# Patient Record
Sex: Female | Born: 1995 | Race: Black or African American | Hispanic: No | Marital: Single | Smoking: Never smoker
Health system: Southern US, Community
[De-identification: ages and names within clinical notes are randomized; demographics above are authoritative.]

## PROBLEM LIST (undated history)

## (undated) DIAGNOSIS — Z789 Other specified health status: Secondary | ICD-10-CM

## (undated) HISTORY — PX: NO PAST SURGERIES: SHX2092

---

## 2020-07-15 ENCOUNTER — Ambulatory Visit (HOSPITAL_COMMUNITY)
Admission: EM | Admit: 2020-07-15 | Discharge: 2020-07-15 | Disposition: A | Discharge status: Home / Self Care | Payer: Medicaid Other | Attending: Emergency Medicine | Admitting: Emergency Medicine

## 2020-07-15 ENCOUNTER — Other Ambulatory Visit: Payer: Self-pay

## 2020-07-15 ENCOUNTER — Encounter (HOSPITAL_COMMUNITY): Payer: Self-pay | Admitting: Emergency Medicine

## 2020-07-15 DIAGNOSIS — M2141 Flat foot [pes planus] (acquired), right foot: Secondary | ICD-10-CM | POA: Diagnosis not present

## 2020-07-15 DIAGNOSIS — M25572 Pain in left ankle and joints of left foot: Secondary | ICD-10-CM | POA: Diagnosis present

## 2020-07-15 DIAGNOSIS — M25571 Pain in right ankle and joints of right foot: Secondary | ICD-10-CM

## 2020-07-15 DIAGNOSIS — M2142 Flat foot [pes planus] (acquired), left foot: Secondary | ICD-10-CM | POA: Diagnosis not present

## 2020-07-15 MED ORDER — MELOXICAM 7.5 MG PO TABS
7.5000 mg | ORAL_TABLET | Freq: Every day | ORAL | 0 refills | Status: DC
Start: 2020-07-15 — End: 2020-08-25

## 2020-07-15 NOTE — ED Provider Notes (Signed)
[place]    CSN: [id number] Arrival date & time: 07/15/20  1010      History   Chief Complaint Chief Complaint  Patient presents with  . Ankle Pain    HPI Sherry Ryan is a 24 y.o. female.   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 No trauam,injury,or fall.  The history is provided by the patient. No language interpreter was used.    History reviewed. No pertinent past medical history.  Patient Active Problem List   Diagnosis Date Noted  . Flat feet, bilateral 07/15/2020  . Acute bilateral ankle pain 07/15/2020    History reviewed. No pertinent surgical history.  OB History   No obstetric history on file.      Home Medications    Prior to Admission medications   Medication Sig Start Date End Date Taking? Authorizing Provider  meloxicam (MOBIC) 7.5 MG tablet Take 1 tablet (7.5 mg total) by mouth daily. 7/82/95   Shanecia Hoganson, Jeanett Schlein, NP    Family History Family History  Problem Relation Age of Onset  . Healthy Mother   . Healthy Father     Social History Social History   Tobacco Use  . Smoking status: Never Smoker  Substance Use Topics  . Alcohol use: Yes  . Drug use: Never     Allergies   Patient has no known allergies.   Review of Systems Review of Systems  Constitutional: Positive for activity change. Negative for fever.  HENT: Negative.   Eyes: Negative.   Respiratory: Negative for shortness of breath.   Cardiovascular: Negative for chest pain.  Gastrointestinal: Negative for abdominal pain.  Endocrine: Negative.   Genitourinary: Negative.   Musculoskeletal: Positive for gait problem and myalgias. Negative for back pain.  Skin: Negative for color change and wound.  Neurological: Negative for headaches.  Hematological: Negative.   Psychiatric/Behavioral: Negative.   All other  systems reviewed and are negative.    Physical Exam Triage Vital Signs ED Triage Vitals  Enc Vitals Group     BP 07/15/20 1130 119/72     Pulse Rate 07/15/20 1130 84     Resp 07/15/20 1130 18     Temp 07/15/20 1130 98.9 F (37.2 C)     Temp Source 07/15/20 1130 Oral     SpO2 07/15/20 1130 97 %     Weight --      Height --      Head Circumference --      Peak Flow --      Pain Score 07/15/20 1127 8     Pain Loc --      Pain Edu? --      Excl. in [place]? --    No data found.  Updated Vital Signs BP 119/72 (BP Location: Right Arm)   Pulse 84   Temp 98.9 F (37.2 C) (Oral)   Resp 18   LMP 06/07/2020   SpO2 97%    Physical Exam Vitals and nursing note reviewed.  Constitutional:      General: She is not in acute distress.    Appearance: She is well-developed. She is not ill-appearing or toxic-appearing.  HENT:     Head: Normocephalic.     Right Ear: Tympanic membrane normal.     Left Ear: Tympanic membrane normal.     Nose: Nose normal.  Mouth/Throat:     Pharynx: Uvula midline.  Eyes:     Pupils: Pupils are equal, round, and reactive to light.  Neck:     Trachea: Trachea normal.     Meningeal: Brudzinski's sign and Kernig's sign absent.  Cardiovascular:     Rate and Rhythm: Normal rate and regular rhythm.     Pulses:          Popliteal pulses are 2+ on the right side and 2+ on the left side.  Pulmonary:     Effort: Pulmonary effort is normal.     Breath sounds: Normal breath sounds.  Musculoskeletal:     Cervical back: Full passive range of motion without pain and normal range of motion.     Right foot: Tenderness present.     Left foot: Tenderness present.     Comments: Extreme flat feet bilateral with inversion of ankle noted as normal stance  Skin:    General: Skin is warm and dry.     Findings: No rash.  Neurological:     General: No focal deficit present.     Mental Status: She is alert and oriented to person, place, and time.     GCS: GCS eye  subscore is 4. GCS verbal subscore is 5. GCS motor subscore is 6.  Psychiatric:        Attention and Perception: Attention normal.        Mood and Affect: Mood normal.        Speech: Speech normal.        Behavior: Behavior normal. Behavior is cooperative.      UC Treatments / Results  Labs (all labs ordered are listed, but only abnormal results are displayed) Labs Reviewed - No data to display  EKG   Radiology No results found.  Procedures Procedures (including critical care time)  Medications Ordered in UC Medications - No data to display  Initial Impression / Assessment and Plan / UC Course  I have reviewed the triage vital signs and the nursing notes.  Pertinent labs & imaging results that were available during my care of the patient were reviewed by me and considered in my medical decision making (see chart for details).     Wear supportive shoes. Follow up with podiatrist of your choice, info regarding [facility] given.  Final Clinical Impressions(s) / UC Diagnoses   Final diagnoses:  Flat feet, bilateral  Acute bilateral ankle pain     Discharge Instructions     Wear supportive shoes. Follow up with podiatrist of your choice, info regarding [facility] given.     ED Prescriptions    Medication Sig Dispense Auth. Provider   meloxicam (MOBIC) 7.5 MG tablet Take 1 tablet (7.5 mg total) by mouth daily. 10 tablet Lorren Splawn, Jeanett Schlein, NP     PDMP not reviewed this encounter.   Tori Milks, NP 28/31/51 1239

## 2020-07-15 NOTE — Discharge Instructions (Addendum)
Wear supportive shoes. Follow up with podiatrist of your choice, info regarding [facility] given.

## 2020-07-15 NOTE — ED Triage Notes (Signed)
Patient has had ankle pain for a week.  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

## 2020-07-28 ENCOUNTER — Ambulatory Visit (INDEPENDENT_AMBULATORY_CARE_PROVIDER_SITE_OTHER): Payer: Medicaid Other

## 2020-07-28 ENCOUNTER — Ambulatory Visit: Payer: Medicaid Other | Admitting: Podiatry

## 2020-07-28 ENCOUNTER — Other Ambulatory Visit: Payer: Self-pay

## 2020-07-28 DIAGNOSIS — M2142 Flat foot [pes planus] (acquired), left foot: Secondary | ICD-10-CM | POA: Diagnosis not present

## 2020-07-28 DIAGNOSIS — M216X1 Other acquired deformities of right foot: Secondary | ICD-10-CM

## 2020-07-28 DIAGNOSIS — M216X2 Other acquired deformities of left foot: Secondary | ICD-10-CM | POA: Diagnosis not present

## 2020-07-28 DIAGNOSIS — M2141 Flat foot [pes planus] (acquired), right foot: Secondary | ICD-10-CM

## 2020-07-28 MED ORDER — DICLOFENAC SODIUM 75 MG PO TBEC
75.0000 mg | DELAYED_RELEASE_TABLET | Freq: Two times a day (BID) | ORAL | 1 refills | Status: DC
Start: 2020-07-28 — End: 2020-08-25

## 2020-07-28 MED ORDER — METHYLPREDNISOLONE 4 MG PO TBPK
ORAL_TABLET | ORAL | 0 refills | Status: DC
Start: 2020-07-28 — End: 2021-10-07

## 2020-07-28 NOTE — Progress Notes (Signed)
   Subjective:  24 y.o. female presenting today as a new patient for evaluation of bilateral foot pain.  Patient states that she works on her feet for most of the day at [employer]  She states that she has had severe pain to the bilateral feet for the past 2-3 months now.  She does wear a brace for support and wears gel inserts to help cushion her feet.  She is unable to stand for long periods of time or walk.  She is also currently taking meloxicam 7.5 mg daily with no relief.  She presents for further treatment evaluation   No past medical history on file.     Objective/Physical Exam General: The patient is alert and oriented x3 in no acute distress.  Dermatology: Skin is warm, dry and supple bilateral lower extremities. Negative for open lesions or macerations.  Vascular: Palpable pedal pulses bilaterally. No edema or erythema noted. Capillary refill within normal limits.  Neurological: Epicritic and protective threshold grossly intact bilaterally.   Musculoskeletal Exam: Range of motion within normal limits to all pedal and ankle joints bilateral. Muscle strength 5/5 in all groups bilateral.  Upon weightbearing there is a medial longitudinal arch collapse bilaterally. Remove foot valgus noted to the bilateral lower extremities with excessive pronation upon mid stance.  Radiographic Exam:  Normal osseous mineralization. Joint spaces preserved. No fracture/dislocation/boney destruction.   Pes planus noted on radiographic exam lateral views. Decreased calcaneal inclination and metatarsal declination angle is noted. Anterior break in the cyma line noted on lateral views. Medial talar head to deviation noted on AP radiograph.   Assessment: 1. pes planus bilateral  Plan of Care:  1. Patient was evaluated. X-Rays reviewed.  2.  Appointment with Pedorthist for custom molded insoles 3.  Prescription for Medrol Dosepak 4.  Prescription for diclofenac 75 mg 2 times daily.  Discontinue  meloxicam 7.5 mg 5.  Recommend good supportive shoes 6.  Return to clinic in 6 months.  If there is no improvement, due to the severe flatfoot deformity, we may need to consider surgical reconstruction   Edrick Kins, DPM [facility]  Dr. Edrick Kins, [address]                                        [place], [place] [address]                Office [phone]  Fax [phone]

## 2020-08-10 ENCOUNTER — Ambulatory Visit (INDEPENDENT_AMBULATORY_CARE_PROVIDER_SITE_OTHER): Payer: Medicaid Other | Admitting: Orthotics

## 2020-08-10 ENCOUNTER — Other Ambulatory Visit: Payer: Self-pay

## 2020-08-10 DIAGNOSIS — M2142 Flat foot [pes planus] (acquired), left foot: Secondary | ICD-10-CM | POA: Diagnosis not present

## 2020-08-10 DIAGNOSIS — M2141 Flat foot [pes planus] (acquired), right foot: Secondary | ICD-10-CM

## 2020-08-10 NOTE — Progress Notes (Signed)
Patient is being seen today for f/o to address congential pes planus/pes planovalgus. Patient is active youth and demonstrates over pronation in gait, prominent medially shifted talus, and collapse of medial column.  Goals are RF stability, longitudinal arch support, decrease in pronation, and ease of discomfort in mobility related activities.   

## 2020-08-25 ENCOUNTER — Telehealth: Payer: Self-pay | Admitting: Podiatrist

## 2020-08-25 MED ORDER — MELOXICAM 15 MG PO TABS: 15.0000 mg | ORAL_TABLET | Freq: Every day | ORAL | 2 refills | Status: DC

## 2020-08-25 NOTE — Telephone Encounter (Signed)
Patient called asking about the medication called in and if it is covered by her insurance (diclofenac)-  I checked, and it is not on the preferred medication list.  Rx for Meloxicam 15 mg was called in after speaking with the patient- she had previously been on meloxicam 7.5 and states it worked ok but did not last.  Discussed with Dr. Amalia Hailey

## 2020-10-12 ENCOUNTER — Encounter: Payer: Medicaid Other | Admitting: Orthotics

## 2020-10-21 ENCOUNTER — Encounter: Payer: Medicaid Other | Admitting: Orthotics

## 2020-11-09 ENCOUNTER — Encounter: Payer: Medicaid Other | Admitting: Orthotics

## 2021-03-21 ENCOUNTER — Other Ambulatory Visit: Payer: Self-pay

## 2021-03-21 ENCOUNTER — Ambulatory Visit (HOSPITAL_COMMUNITY)
Admission: EM | Admit: 2021-03-21 | Discharge: 2021-03-21 | Disposition: A | Discharge status: Home / Self Care | Payer: Medicaid Other | Attending: Physician Assistant | Admitting: Physician Assistant

## 2021-03-21 ENCOUNTER — Encounter (HOSPITAL_COMMUNITY): Payer: Self-pay

## 2021-03-21 DIAGNOSIS — H6123 Impacted cerumen, bilateral: Secondary | ICD-10-CM | POA: Diagnosis not present

## 2021-03-21 DIAGNOSIS — H938X3 Other specified disorders of ear, bilateral: Secondary | ICD-10-CM | POA: Diagnosis not present

## 2021-03-21 DIAGNOSIS — H60392 Other infective otitis externa, left ear: Secondary | ICD-10-CM | POA: Diagnosis not present

## 2021-03-21 MED ORDER — OFLOXACIN 0.3 % OT SOLN
5.0000 [drp] | Freq: Two times a day (BID) | OTIC | 0 refills | Status: DC
Start: 2021-03-21 — End: 2021-10-07

## 2021-03-21 NOTE — ED Provider Notes (Signed)
[place]    CSN: [id number] Arrival date & time: 03/21/21  1403      History   Chief Complaint Chief Complaint  Patient presents with  . Ear Fullness    HPI Sherry Ryan is a 25 y.o. female.   Sherry Ryan presents today with a several week history of ear fullness. She denies any additional symptoms including cough, congestion, otorrhea, fever. Pain is rated 7 on a 0-10 pain scale, localized to bilateral ears without radiation, described as popping, no aggravating or alleviating factors identified. She has tried hydrogen peroxide without improvement of symptoms. She denies history of otitis media and has not seen ENT in the past. She denies recent diving, swimming, air plane travel. She has taken tylenol and ibuprofen without improvement of symptoms.      History reviewed. No pertinent past medical history.  Patient Active Problem List   Diagnosis Date Noted  . Flat feet, bilateral 07/15/2020  . Acute bilateral ankle pain 07/15/2020    History reviewed. No pertinent surgical history.  OB History   No obstetric history on file.      Home Medications    Prior to Admission medications   Medication Sig Start Date End Date Taking? Authorizing Provider  ofloxacin (FLOXIN) 0.3 % OTIC solution Place 5 drops into the left ear 2 (two) times daily. 03/21/21  Yes Hilliard Borges, Derry Skill, PA-C  meloxicam (MOBIC) 15 MG tablet Take 1 tablet (15 mg total) by mouth daily. Take with food. 08/25/20   Bronson Ing, DPM  methylPREDNISolone (MEDROL DOSEPAK) 4 MG TBPK tablet 6 day dose pack - take as directed 07/28/20   Edrick Kins, DPM    Family History Family History  Problem Relation Age of Onset  . Healthy Mother   . Healthy Father     Social History Social History   Tobacco Use  . Smoking status: Never Smoker  Substance Use Topics  . Alcohol use: Yes  . Drug use: Never     Allergies   Patient has no known allergies.   Review of Systems Review of  Systems  Constitutional: Negative for activity change, appetite change, fatigue and fever.  HENT: Positive for ear pain and hearing loss. Negative for congestion, sinus pressure, sneezing and sore throat.   Respiratory: Negative for cough and shortness of breath.   Cardiovascular: Negative for chest pain.  Gastrointestinal: Negative for abdominal pain, diarrhea, nausea and vomiting.  Neurological: Negative for dizziness, light-headedness and headaches.     Physical Exam Triage Vital Signs ED Triage Vitals  Enc Vitals Group     BP 03/21/21 1450 130/85     Pulse Rate 03/21/21 1450 98     Resp 03/21/21 1450 18     Temp 03/21/21 1450 98.7 F (37.1 C)     Temp src --      SpO2 03/21/21 1450 100 %     Weight --      Height --      Head Circumference --      Peak Flow --      Pain Score 03/21/21 1449 7     Pain Loc --      Pain Edu? --      Excl. in [place]? --    No data found.  Updated Vital Signs BP 130/85   Pulse 98   Temp 98.7 F (37.1 C)   Resp 18   LMP 03/21/2021   SpO2 100%   Visual Acuity Right Eye Distance:  Left Eye Distance:   Bilateral Distance:    Right Eye Near:   Left Eye Near:    Bilateral Near:     Physical Exam Vitals reviewed.  Constitutional:      General: She is awake. She is not in acute distress.    Appearance: Normal appearance. She is not ill-appearing.     Comments: Very pleasant female appears stated age in no acute distress   HENT:     Head: Normocephalic and atraumatic.     Right Ear: Tympanic membrane, ear canal and external ear normal. There is impacted cerumen. Tympanic membrane is not erythematous or bulging.     Left Ear: Tympanic membrane, ear canal and external ear normal. There is impacted cerumen. Tympanic membrane is not erythematous or bulging.     Ears:     Comments: Cerumen impaction noted on exam. Resolved with irrigation revealing normal TM on right. Continued cerumen impaction on left as patient was not able to tolerate  irrigation. Erythema noted of left ear canal without otorrhea; mild tenderness to palpation of tragus.      Mouth/Throat:     Pharynx: Uvula midline. No oropharyngeal exudate or posterior oropharyngeal erythema.     Comments: Drainage present in posterior oropharynx.  Cardiovascular:     Rate and Rhythm: Normal rate and regular rhythm.     Heart sounds: No murmur heard.   Pulmonary:     Effort: Pulmonary effort is normal.     Breath sounds: Normal breath sounds. No wheezing, rhonchi or rales.     Comments: Clear to auscultation bilaterally  Musculoskeletal:     Right lower leg: No edema.     Left lower leg: No edema.  Lymphadenopathy:     Head:     Right side of head: No submental, submandibular or tonsillar adenopathy.     Left side of head: No submental, submandibular or tonsillar adenopathy.     Cervical: No cervical adenopathy.  Psychiatric:        Behavior: Behavior is cooperative.      UC Treatments / Results  Labs (all labs ordered are listed, but only abnormal results are displayed) Labs Reviewed - No data to display  EKG   Radiology No results found.  Procedures Procedures (including critical care time)  Medications Ordered in UC Medications - No data to display  Initial Impression / Assessment and Plan / UC Course  I have reviewed the triage vital signs and the nursing notes.  Pertinent labs & imaging results that were available during my care of the patient were reviewed by me and considered in my medical decision making (see chart for details).     Cerumen impaction resolved with in office irrigation on right. Patient was unable to tolerate irrigation on left. She was encouraged to use over the counter cerumenolytics. Given significant erythema and pain with percussion of tragus will treat for otitis externa and patient prescribed ofloxacin drops. She was instructed to avoid putting anything in ear canals or submerging head in water until symptoms  resolve. She was instructed to follow up with PCP for recheck in a week. Strict return precautions given to which patient expressed understanding.   Final Clinical Impressions(s) / UC Diagnoses   Final diagnoses:  Ear fullness, bilateral  Bilateral impacted cerumen  Infective otitis externa of left ear     Discharge Instructions     Use the drops twice a day. Keep ear facing up for several minutes after using drops. Do  not use q-tips. You can use over the counter medication for pain relief.      ED Prescriptions    Medication Sig Dispense Auth. Provider   ofloxacin (FLOXIN) 0.3 % OTIC solution Place 5 drops into the left ear 2 (two) times daily. 5 mL Eilah Common K, PA-C     PDMP not reviewed this encounter.   Terrilee Croak, PA-C 03/21/21 1551

## 2021-03-21 NOTE — ED Triage Notes (Signed)
Pt presents with c/o bilateral ear fullness, some soreness in left ear , symptoms began about a week ago

## 2021-03-21 NOTE — Discharge Instructions (Addendum)
Use the drops twice a day. Keep ear facing up for several minutes after using drops. Do not use q-tips. You can use over the counter medication for pain relief.

## 2021-03-21 NOTE — ED Notes (Addendum)
Ear wax removal right ear, pt did not tolerate in the left ear, Raspet, PA notified.

## 2021-06-15 ENCOUNTER — Other Ambulatory Visit: Payer: Self-pay | Admitting: Internal Medicine

## 2021-06-15 DIAGNOSIS — R1900 Intra-abdominal and pelvic swelling, mass and lump, unspecified site: Secondary | ICD-10-CM

## 2021-07-12 ENCOUNTER — Other Ambulatory Visit: Payer: Medicaid Other

## 2021-08-17 ENCOUNTER — Other Ambulatory Visit: Payer: Self-pay | Admitting: Internal Medicine

## 2021-08-17 ENCOUNTER — Other Ambulatory Visit: Payer: Self-pay | Admitting: Family Medicine

## 2021-08-17 DIAGNOSIS — R1900 Intra-abdominal and pelvic swelling, mass and lump, unspecified site: Secondary | ICD-10-CM

## 2021-08-18 ENCOUNTER — Ambulatory Visit
Admission: RE | Admit: 2021-08-18 | Discharge: 2021-08-18 | Disposition: A | Discharge status: Home / Self Care | Payer: Medicaid Other | Source: Ambulatory Visit | Attending: Internal Medicine | Admitting: Internal Medicine

## 2021-08-18 ENCOUNTER — Other Ambulatory Visit: Payer: Self-pay

## 2021-08-18 DIAGNOSIS — R1900 Intra-abdominal and pelvic swelling, mass and lump, unspecified site: Secondary | ICD-10-CM

## 2021-08-30 NOTE — Progress Notes (Deleted)
GYNECOLOGY OFFICE VISIT NOTE  History:   Sherry Ryan is a 25 y.o. here today for ovarian mass by [place]. She had the [place] for abdominal and pelvic swelling. She has a CT scheduled for 9/21.     No past medical history on file.  No past surgical history on file.  The following portions of the patient's history were reviewed and updated as appropriate: allergies, current medications, past family history, past medical history, past social history, past surgical history and problem list.   Health Maintenance:  Normal pap and negative HRHPV on ***.  Normal mammogram on ***.   Review of Systems:  Pertinent items noted in HPI and remainder of comprehensive ROS otherwise negative.  Physical Exam:  There were no vitals taken for this visit. CONSTITUTIONAL: Well-developed, well-nourished female in no acute distress.  HEENT:  Normocephalic, atraumatic. External right and left ear normal. No scleral icterus.  NECK: Normal range of motion, supple, no masses noted on observation SKIN: No rash noted. Not diaphoretic. No erythema. No pallor. MUSCULOSKELETAL: Normal range of motion. No edema noted. NEUROLOGIC: Alert and oriented to person, place, and time. Normal muscle tone coordination. No cranial nerve deficit noted. PSYCHIATRIC: Normal mood and affect. Normal behavior. Normal judgment and thought content.  CARDIOVASCULAR: Normal heart rate noted RESPIRATORY: Effort and breath sounds normal, no problems with respiration noted ABDOMEN: No masses noted. No other overt distention noted.    PELVIC: {Blank single:19197::"Deferred","Normal appearing external genitalia; normal urethral meatus; normal appearing vaginal mucosa and cervix.  No abnormal discharge noted.  Normal uterine size, no other palpable masses, no uterine or adnexal tenderness. Performed in the presence of a chaperone"}  Labs and Imaging No results found for this or any previous visit (from the past 168 hour(s)). US Abdomen  Limited  Result Date: 08/18/2021 CLINICAL DATA:  ABDOMINAL AND PELVIC SWELLING EXAM: TRANSABDOMINAL AND TRANSVAGINAL ULTRASOUND OF PELVIS ULTRASOUND ABDOMEN LIMITED TECHNIQUE: Both transabdominal and transvaginal ultrasound examinations of the pelvis were performed. Transabdominal technique was performed for global imaging of the pelvis including uterus, ovaries, adnexal regions, and pelvic cul-de-sac. It was necessary to proceed with endovaginal exam following the transabdominal exam to visualize the ovaries. COMPARISON:  None FINDINGS: Uterus Measurements: 8.4 x 4.8 x 6.4 cm = volume: 133.5 mL. No fibroids or other mass visualized. Endometrium Thickness: 11.  No focal abnormality visualized. Right ovary Right ovary is difficult to measure and may be part of complex mass or adjacent to complex cystic lesion. Left ovary Not visualized. Other findings Complex cystic lesion extending from the superior abdomen to the lower pelvis with multiple septations and a solid component. It measures approximately 36.8 x 11.9 x 23.5 cm. Limited color flow is seen. IMPRESSION: Large complex cystic lesion extending from the superior abdomen to the lower pelvis with multiple septations and a solid component, possibly arising from the right ovary. Finding is concerning for neoplasm. Recommend contrast-enhanced CT of the abdomen and pelvis for further evaluation. Electronically Signed   By: Yetta Glassman M.D.   On: 08/18/2021 12:34   US PELVIC COMPLETE WITH TRANSVAGINAL  Result Date: 08/18/2021 CLINICAL DATA:  ABDOMINAL AND PELVIC SWELLING EXAM: TRANSABDOMINAL AND TRANSVAGINAL ULTRASOUND OF PELVIS ULTRASOUND ABDOMEN LIMITED TECHNIQUE: Both transabdominal and transvaginal ultrasound examinations of the pelvis were performed. Transabdominal technique was performed for global imaging of the pelvis including uterus, ovaries, adnexal regions, and pelvic cul-de-sac. It was necessary to proceed with endovaginal exam following the  transabdominal exam to visualize the ovaries. COMPARISON:  None FINDINGS:  Uterus Measurements: 8.4 x 4.8 x 6.4 cm = volume: 133.5 mL. No fibroids or other mass visualized. Endometrium Thickness: 11.  No focal abnormality visualized. Right ovary Right ovary is difficult to measure and may be part of complex mass or adjacent to complex cystic lesion. Left ovary Not visualized. Other findings Complex cystic lesion extending from the superior abdomen to the lower pelvis with multiple septations and a solid component. It measures approximately 36.8 x 11.9 x 23.5 cm. Limited color flow is seen. IMPRESSION: Large complex cystic lesion extending from the superior abdomen to the lower pelvis with multiple septations and a solid component, possibly arising from the right ovary. Finding is concerning for neoplasm. Recommend contrast-enhanced CT of the abdomen and pelvis for further evaluation. Electronically Signed   By: Yetta Glassman M.D.   On: 08/18/2021 12:34       I reviewed the images of these ultrasounds myself Assessment and Plan:    There are no diagnoses linked to this encounter.  Routine preventative health maintenance measures emphasized. Please refer to After Visit Summary for other counseling recommendations.   No follow-ups on file.    I spent {Blank single:19197::"10","15","20","25","30"} minutes dedicated to the care of this patient including pre-visit review of records, face to face time with the patient discussing her conditions and treatments and post visit orders.    Radene Gunning, MD, [place] [facility], [place]

## 2021-08-31 ENCOUNTER — Encounter: Payer: Medicaid Other | Admitting: Obstetrics and Gynecology

## 2021-08-31 ENCOUNTER — Encounter: Payer: Medicaid Other | Admitting: Family Medicine

## 2021-08-31 ENCOUNTER — Encounter: Payer: Self-pay | Admitting: Family Medicine

## 2021-08-31 DIAGNOSIS — R1900 Intra-abdominal and pelvic swelling, mass and lump, unspecified site: Secondary | ICD-10-CM

## 2021-08-31 NOTE — Progress Notes (Signed)
Patient did not keep appointment today. She may call to reschedule.  

## 2021-08-31 NOTE — Progress Notes (Deleted)
Pt did not show for appointment. Will have staff reach out to reschedule her.

## 2021-09-07 ENCOUNTER — Other Ambulatory Visit: Payer: Medicaid Other

## 2021-09-23 ENCOUNTER — Ambulatory Visit
Admission: RE | Admit: 2021-09-23 | Discharge: 2021-09-23 | Disposition: A | Discharge status: Home / Self Care | Payer: Medicaid Other | Source: Ambulatory Visit | Attending: Internal Medicine | Admitting: Internal Medicine

## 2021-09-23 DIAGNOSIS — R1900 Intra-abdominal and pelvic swelling, mass and lump, unspecified site: Secondary | ICD-10-CM

## 2021-09-23 MED ORDER — IOPAMIDOL (ISOVUE-300) INJECTION 61%
100.0000 mL | Freq: Once | INTRAVENOUS | Status: AC | PRN
  Administered 2021-09-23: 100 mL via INTRAVENOUS

## 2021-10-07 ENCOUNTER — Other Ambulatory Visit: Payer: Self-pay

## 2021-10-07 ENCOUNTER — Telehealth: Payer: Self-pay

## 2021-10-07 ENCOUNTER — Ambulatory Visit (INDEPENDENT_AMBULATORY_CARE_PROVIDER_SITE_OTHER): Payer: Medicaid Other | Admitting: Family Medicine

## 2021-10-07 ENCOUNTER — Other Ambulatory Visit (HOSPITAL_COMMUNITY)
Admission: RE | Admit: 2021-10-07 | Discharge: 2021-10-07 | Disposition: A | Discharge status: Home / Self Care | Payer: Medicaid Other | Source: Ambulatory Visit | Attending: Family Medicine | Admitting: Family Medicine

## 2021-10-07 ENCOUNTER — Encounter: Payer: Self-pay | Admitting: Family Medicine

## 2021-10-07 VITALS — BP 133/86 | HR 98 | Ht 73.0 in | Wt 207.2 lb

## 2021-10-07 DIAGNOSIS — R1900 Intra-abdominal and pelvic swelling, mass and lump, unspecified site: Secondary | ICD-10-CM | POA: Insufficient documentation

## 2021-10-07 DIAGNOSIS — Z124 Encounter for screening for malignant neoplasm of cervix: Secondary | ICD-10-CM | POA: Diagnosis not present

## 2021-10-07 DIAGNOSIS — Z638 Other specified problems related to primary support group: Secondary | ICD-10-CM | POA: Insufficient documentation

## 2021-10-07 HISTORY — DX: Intra-abdominal and pelvic swelling, mass and lump, unspecified site: R19.00

## 2021-10-07 NOTE — Progress Notes (Signed)
GYNECOLOGY OFFICE VISIT NOTE  History:   Sherry Ryan is a 25 y.o. [id number] here today for follow up of abnormal imaging.  First told she had a cyst when she was pregnant with her son in 2017 Does not have pain but started to have a tightness Then noticed that her belly was enlarging and she was gaining weight Subsequently over the past year she has lost 55 pounds Had CT scan on 09/23/21 that showed large pelvic mass (see below) Ordered by Dr. Maia Petties at [facility] No fevers or night sweats Occasional early satiety Mother had an ovarian cyst that she was told was large and had to be removed No other cancers in the family  Health Maintenance Due  Topic Date Due   HIV Screening  Never done   Hepatitis C Screening  Never done   PAP-Cervical Cytology Screening  Never done   PAP SMEAR-Modifier  Never done    History reviewed. No pertinent past medical history.  History reviewed. No pertinent surgical history.  The following portions of the patient's history were reviewed and updated as appropriate: allergies, current medications, past family history, past medical history, past social history, past surgical history and problem list.   Health Maintenance:   Last pap: No results found for: DIAGPAP, HPV, [place] Reports normal a few years ago but no record on file and not sure about date  Last mammogram:  N/a    Review of Systems:  Pertinent items noted in HPI and remainder of comprehensive ROS otherwise negative.  Physical Exam:  BP 133/86   Pulse 98   Ht 6\' 1"  (1.854 m)   Wt 207 lb 3.2 oz (94 kg)   LMP 10/03/2021 (Exact Date)   BMI 27.34 kg/m  CONSTITUTIONAL: Well-developed, well-nourished female in no acute distress.  HEENT:  Normocephalic, atraumatic. External right and left ear normal. No scleral icterus.  NECK: Normal range of motion, supple, no masses noted on observation SKIN: No rash noted. Not diaphoretic. No erythema. No  pallor. MUSCULOSKELETAL: Normal range of motion. No edema noted. NEUROLOGIC: Alert and oriented to person, place, and time. Normal muscle tone coordination.  PSYCHIATRIC: Normal mood and affect. Normal behavior. Normal judgment and thought content. RESPIRATORY: Effort normal, no problems with respiration noted ABDOMEN: Large firm mass palpated extending from patient's pubic bone to one hand breath below her xiphoid. Crosses midline. Mildly tender throughout, no rebound or guarding.  PELVIC: Normal appearing external genitalia; normal appearing vaginal mucosa and cervix. Cervix very posterior and difficult to visualize due to mass effect. No abnormal discharge noted.  Abdominal mass palpated but did not seem to be down in the pelvic fossa at all bilaterally.  Labs and Imaging No results found for this or any previous visit (from the past 168 hour(s)). CT ABDOMEN PELVIS W CONTRAST  Result Date: 09/26/2021 CLINICAL DATA:  Pelvic swelling.  Evaluate for mass lesion. EXAM: CT ABDOMEN AND PELVIS WITH CONTRAST TECHNIQUE: Multidetector CT imaging of the abdomen and pelvis was performed using the standard protocol following bolus administration of intravenous contrast. CONTRAST:  111mL ISOVUE-300 IOPAMIDOL (ISOVUE-300) INJECTION 61% COMPARISON:  Ultrasound exam of the pelvis 08/18/2021. FINDINGS: Lower chest: Unremarkable. Hepatobiliary: No suspicious focal abnormality within the liver parenchyma. There is no evidence for gallstones, gallbladder wall thickening, or pericholecystic fluid. No intrahepatic or extrahepatic biliary dilation. Pancreas: No focal mass lesion. No dilatation of the main duct. No intraparenchymal cyst. No peripancreatic edema. Spleen: No splenomegaly. No focal mass lesion. Adrenals/Urinary Tract: No  adrenal nodule or mass. Kidneys unremarkable. No evidence for hydroureter. Bladder decompressed. Stomach/Bowel: Stomach is unremarkable. No gastric wall thickening. No evidence of outlet  obstruction. Duodenum not well seen secondary to mass-effect from the large abdominopelvic cystic mass. Small bowel is largely displaced into the right abdomen and pelvis. No small bowel or colonic dilatation. Vascular/Lymphatic: No abdominal aortic aneurysm. Portal vein and superior mesenteric vein are patent. Reproductive: Uterus unremarkable. Large multiloculated cystic mass is identified in the abdomen and pelvis measuring approximately 35.7 x 23.1 x 16.7 cm. Lesion generates substantial mass-effect on bowel loops and retroperitoneal anatomy of the abdomen and pelvis. Subtle apparent soft tissue nodule measuring 4.4 cm nodule identified in the posterior aspect of the most cranial loculation (see axial image 31 of series 2). There also appears to be a soft tissue nodule in the cranial most component measuring 12 mm on axial 42/2 and visible on sagittal 171/8. Septations measure approximately 5 mm in thickness. The wall of the lesion appears relatively thin and smooth throughout. Other: No substantial ascites. Musculoskeletal: No worrisome lytic or sclerotic osseous abnormality. IMPRESSION: 1. 35.7 x 23.1 x 16.7 cm multiloculated cystic mass in the abdomen and pelvis generates substantial mass-effect on bowel loops and retroperitoneal anatomy of the abdomen and pelvis. 2 soft tissue nodules are identified in the cranial most component of the multicystic mass. Imaging features concerning for neoplasm. Origin of this lesion is not discernible by CT imaging although ovarian etiology would be a distinct consideration. Mesenteric origin not excluded. 2. No ascites or lymphadenopathy. Electronically Signed   By: Misty Stanley M.D.   On: 09/26/2021 10:06      Assessment and Plan:   Problem List Items Addressed This Visit       Other   Pelvic mass - Primary    Large pelvic mass with concerning features on CT. Will refer urgently to [place]. Secure chatted with Dr. Berline Lopes, she will see patient on 10/12/21 and I  have also ordered labs per her recommendations. On exam extends almost to xiphoid, also collected pap as none on file. Pilar Plate discussion with patient that mass is concerning, but we will probably not know if benign or malignant until we have pathology. She is amenable with plan.       Relevant Orders   CA 125   AFP tumor marker   Beta hCG quant (ref lab)   Lactate dehydrogenase   Inhibin B   CEA   Cytology - PAP( [place])   Ambulatory referral to Gynecologic Oncology   Other Visit Diagnoses     Screening for cervical cancer           Routine preventative health maintenance measures emphasized. Please refer to After Visit Summary for other counseling recommendations.   Return as needed.    Total face-to-face time with patient: 30 minutes.  Over 50% of encounter was spent on counseling and coordination of care.   Clarnce Flock, MD/MPH Attending Family Medicine Physician, [facility] for [facility], [place]

## 2021-10-07 NOTE — Telephone Encounter (Signed)
Spoke with Sherry Ryan regarding her referral to GYN oncology. She has an appointment scheduled with Dr. Berline Lopes on 10/10/21 at 11:45. Patient agrees to date and time. She has been provided with office address and location. She is also aware of our mask and visitor policy. Patient verbalized understanding and will call with any questions.

## 2021-10-07 NOTE — Assessment & Plan Note (Signed)
Large pelvic mass with concerning features on CT. Will refer urgently to [place]. Secure chatted with Dr. Berline Lopes, she will see patient on 10/12/21 and I have also ordered labs per her recommendations. On exam extends almost to xiphoid, also collected pap as none on file. Pilar Plate discussion with patient that mass is concerning, but we will probably not know if benign or malignant until we have pathology. She is amenable with plan.

## 2021-10-08 LAB — LACTATE DEHYDROGENASE: LDH: 186 IU/L (ref 119–226)

## 2021-10-10 ENCOUNTER — Inpatient Hospital Stay (HOSPITAL_BASED_OUTPATIENT_CLINIC_OR_DEPARTMENT_OTHER): Payer: Medicaid Other | Admitting: Gynecologic Oncology

## 2021-10-10 ENCOUNTER — Inpatient Hospital Stay: Payer: Medicaid Other | Attending: Gynecologic Oncology | Admitting: Gynecologic Oncology

## 2021-10-10 ENCOUNTER — Encounter: Payer: Self-pay | Admitting: Gynecologic Oncology

## 2021-10-10 ENCOUNTER — Other Ambulatory Visit: Payer: Self-pay

## 2021-10-10 ENCOUNTER — Other Ambulatory Visit: Payer: Self-pay | Admitting: Gynecologic Oncology

## 2021-10-10 VITALS — BP 138/92 | HR 91 | Temp 98.5°F | Resp 16 | Ht 73.0 in | Wt 204.0 lb

## 2021-10-10 DIAGNOSIS — R1900 Intra-abdominal and pelvic swelling, mass and lump, unspecified site: Secondary | ICD-10-CM

## 2021-10-10 LAB — CEA: CEA: 1.4 ng/mL (ref 0.0–4.7)

## 2021-10-10 LAB — CA 125: Cancer Antigen (CA) 125: 34.2 U/mL (ref 0.0–38.1)

## 2021-10-10 LAB — INHIBIN B: Inhibin B: 135.3 pg/mL

## 2021-10-10 LAB — AFP TUMOR MARKER: AFP, Serum, Tumor Marker: 3.6 ng/mL (ref 0.0–4.7)

## 2021-10-10 LAB — BETA HCG QUANT (REF LAB): hCG Quant: <1 m[IU]/mL

## 2021-10-10 MED ORDER — OXYCODONE HCL 5 MG PO TABS: 5.0000 mg | ORAL_TABLET | ORAL | 0 refills | Status: AC | PRN

## 2021-10-10 MED ORDER — IBUPROFEN 800 MG PO TABS: 800.0000 mg | ORAL_TABLET | Freq: Three times a day (TID) | ORAL | 0 refills | Status: AC | PRN

## 2021-10-10 MED ORDER — SENNOSIDES-DOCUSATE SODIUM 8.6-50 MG PO TABS: 2.0000 | ORAL_TABLET | Freq: Every day | ORAL | 0 refills | Status: AC

## 2021-10-10 NOTE — Patient Instructions (Signed)
Preparing for your Surgery  Plan for surgery on October 20, 2021 with Dr. Jeral Pinch at [facility]. You will be scheduled for a mini laparotomy for cyst decompression, robotic assisted unilateral salpingo-oophorectomy (removal of one ovary and fallopian tube), mass excision, possible fertility sparing staging if a cancer is identified.   Pre-operative Testing -You will receive a phone call from presurgical testing at [facility] to arrange for a pre-operative appointment and lab work.  -Bring your insurance card, copy of an advanced directive if applicable, medication list  -At that visit, you will be asked to sign a consent for a possible blood transfusion in case a transfusion becomes necessary during surgery.  The need for a blood transfusion is rare but having consent is a necessary part of your care.     -You should not be taking blood thinners or aspirin at least ten days prior to surgery unless instructed by your surgeon.  -Do not take supplements such as fish oil (omega 3), red yeast rice, turmeric before your surgery. You want to avoid medications with aspirin in them including headache powders such as BC or Goody's), Excedrin migraine.  Day Before Surgery at [place] will be asked to take in a light diet the day before surgery. You will be advised you can have clear liquids up until 3 hours before your surgery.    Eat a light diet the day before surgery.  Examples including soups, broths, toast, yogurt, mashed potatoes.  AVOID GAS PRODUCING FOODS. Things to avoid include carbonated beverages (fizzy beverages, sodas), raw fruits and raw vegetables (uncooked), or beans.   If your bowels are filled with gas, your surgeon will have difficulty visualizing your pelvic organs which increases your surgical risks.  Your role in recovery Your role is to become active as soon as directed by your doctor, while still giving yourself time  to heal.  Rest when you feel tired. You will be asked to do the following in order to speed your recovery:  - Cough and breathe deeply. This helps to clear and expand your lungs and can prevent pneumonia after surgery.  - [place]. Do mild physical activity. Walking or moving your legs help your circulation and body functions return to normal. Do not try to get up or walk alone the first time after surgery.   -If you develop swelling on one leg or the other, pain in the back of your leg, redness/warmth in one of your legs, please call the office or go to the Emergency Room to have a doppler to rule out a blood clot. For shortness of breath, chest pain-seek care in the Emergency Room as soon as possible. - Actively manage your pain. Managing your pain lets you move in comfort. We will ask you to rate your pain on a scale of zero to 10. It is your responsibility to tell your doctor or nurse where and how much you hurt so your pain can be treated.  Special Considerations -Your final pathology results from surgery should be available around one week after surgery and the results will be relayed to you when available.  -Dr. Lahoma Crocker is the surgeon that assists your GYN Oncologist with surgery.  If you end up staying the night, the next day after your surgery you will either see Dr. Denman George, Dr. Berline Lopes, or Dr. Lahoma Crocker.  -FMLA forms can be faxed to [phone] and please allow 5-7 business  days for completion.  Pain Management After Surgery -You have been prescribed your pain medication and bowel regimen medications before surgery so that you can have these available when you are discharged from the hospital. The pain medication is for use ONLY AFTER surgery and a new prescription will not be given.   -Make sure that you have Tylenol and Ibuprofen at home to use on a regular basis after surgery for pain control. We recommend alternating the medications every hour to  six hours since they work differently and are processed in the body differently for pain relief.  -Review the attached handout on narcotic use and their risks and side effects.   Bowel Regimen -You have been prescribed Sennakot-S to take nightly to prevent constipation especially if you are taking the narcotic pain medication intermittently.  It is important to prevent constipation and drink adequate amounts of liquids. You can stop taking this medication when you are not taking pain medication and you are back on your normal bowel routine.  Risks of Surgery Risks of surgery are low but include bleeding, infection, damage to surrounding structures, re-operation, blood clots, and very rarely death.   Blood Transfusion Information (For the consent to be signed before surgery)  We will be checking your blood type before surgery so in case of emergencies, we will know what type of blood you would need.                                            WHAT IS A BLOOD TRANSFUSION?  A transfusion is the replacement of blood or some of its parts. Blood is made up of multiple cells which provide different functions. Red blood cells carry oxygen and are used for blood loss replacement. White blood cells fight against infection. Platelets control bleeding. Plasma helps clot blood. Other blood products are available for specialized needs, such as hemophilia or other clotting disorders. BEFORE THE TRANSFUSION  Who gives blood for transfusions?  You may be able to donate blood to be used at a later date on yourself (autologous donation). Relatives can be asked to donate blood. This is generally not any safer than if you have received blood from a stranger. The same precautions are taken to ensure safety when a relative's blood is donated. Healthy volunteers who are fully evaluated to make sure their blood is safe. This is blood bank blood. Transfusion therapy is the safest it has ever been in the practice of  medicine. Before blood is taken from a donor, a complete history is taken to make sure that person has no history of diseases nor engages in risky social behavior (examples are intravenous drug use or sexual activity with multiple partners). The donor's travel history is screened to minimize risk of transmitting infections, such as malaria. The donated blood is tested for signs of infectious diseases, such as HIV and hepatitis. The blood is then tested to be sure it is compatible with you in order to minimize the chance of a transfusion reaction. If you or a relative donates blood, this is often done in anticipation of surgery and is not appropriate for emergency situations. It takes many days to process the donated blood. RISKS AND COMPLICATIONS Although transfusion therapy is very safe and saves many lives, the main dangers of transfusion include:  Getting an infectious disease. Developing a transfusion reaction. This is an allergic  reaction to something in the blood you were given. Every precaution is taken to prevent this. The decision to have a blood transfusion has been considered carefully by your caregiver before blood is given. Blood is not given unless the benefits outweigh the risks.  AFTER SURGERY INSTRUCTIONS  Return to work: 4-6 weeks if applicable  You will have a white honeycomb dressing over your larger incision. This dressing can be removed 5 days after surgery and you do not need to reapply a new dressing. Once you remove the dressing, you will notice that you have the surgical glue (dermabond) on the incision and this will peel off on its own. You can get this dressing wet in the shower the days after surgery prior to removal on the 5th day.   Activity: 1. Be up and out of the bed during the day.  Take a nap if needed.  You may walk up steps but be careful and use the hand rail.  Stair climbing will tire you more than you think, you may need to stop part way and rest.   2. No  lifting or straining for 6 weeks over 10 pounds. No pushing, pulling, straining for 6 weeks.  3. No driving for 1 week(s).  Do not drive if you are taking narcotic pain medicine and make sure that your reaction time has returned.   4. You can shower as soon as the next day after surgery. Shower daily.  Use your regular soap and water (not directly on the incision) and pat your incision(s) dry afterwards; don't rub.  No tub baths or submerging your body in water until cleared by your surgeon. If you have the soap that was given to you by pre-surgical testing that was used before surgery, you do not need to use it afterwards because this can irritate your incisions.   5. No sexual activity and nothing in the vagina for 4 weeks.  6. You may experience a small amount of clear drainage from your incisions, which is normal.  If the drainage persists, increases, or changes color please call the office.  7. Do not use creams, lotions, or ointments such as neosporin on your incisions after surgery until advised by your surgeon because they can cause removal of the dermabond glue on your incisions.    8. You may experience vaginal spotting after surgery.  The spotting is normal but if you experience heavy bleeding, call our office.  9. Take Tylenol or ibuprofen first for pain and only use narcotic pain medication for severe pain not relieved by the Tylenol or Ibuprofen.  Monitor your Tylenol intake to a max of 4,000 mg in a 24 hour period. You can alternate these medications after surgery.  Diet: 1. Low sodium Heart Healthy Diet is recommended but you are cleared to resume your normal (before surgery) diet after your procedure.  2. It is safe to use a laxative, such as Miralax or Colace, if you have difficulty moving your bowels. You have been prescribed Sennakot at bedtime every evening to keep bowel movements regular and to prevent constipation.    Wound Care: 1. Keep clean and dry.  Shower  daily.  Reasons to call the Doctor: Fever - Oral temperature greater than 100.4 degrees Fahrenheit Foul-smelling vaginal discharge Difficulty urinating Nausea and vomiting Increased pain at the site of the incision that is unrelieved with pain medicine. Difficulty breathing with or without chest pain New calf pain especially if only on one side Sudden, continuing  increased vaginal bleeding with or without clots.   Contacts: For questions or concerns you should contact:  Dr. Jeral Pinch at [phone]  Joylene John, NP at [phone]  After Hours: call [phone] and have the GYN Oncologist paged/contacted (after 5 pm or on the weekends).  Messages sent via mychart are for non-urgent matters and are not responded to after hours so for urgent needs, please call the after hours number.

## 2021-10-10 NOTE — Progress Notes (Signed)
GYNECOLOGIC ONCOLOGY NEW PATIENT CONSULTATION   Patient Name: Sherry Ryan  Patient Age: 25 y.o. Date of Service: 10/10/21 Referring Provider: Dr. Clayton Lefort  Primary Care Provider: Audley Hose, MD Consulting Provider: Jeral Pinch, MD   Assessment/Plan:  Premenopausal patient with most cystic large abdominopelvic mass.    I discussed with the patient her work up this far. We looked at her CT images together, which show a very large cystic mass with multiple septations. Given size of the mass as well as suspected existence back in 2017 at the time of her last pregnancy, I favor that this is either a benign ovarian mass or borderline tumor. We reviewed that the only way to make a definitive diagnosis is to proceed with surgery for excision. Given that size, it would likely not be feasible to remove intact. My recommendation is to proceed with mini laparotomy for controlled cyst drainage followed by robotic excision of the mass, likely unilateral salpingo-oophorectomy. Will plan to send the mass for frozen section. If results show benign mass, then no additional surgery will be performed. In the setting of a borderline tumor, we discussed that given her young age, as long as other fallopian tube and ovary as well as uterus and cervix were normal in appearance, they would be left in situ (with plan for completion surgery in the future). In the setting of malignancy identified on frozen section, then additional staging procedure such as lymph node biopsy and omentectomy would be performed based on histology. It is unlikely that in a patient this age she would have epithelial ovarian cancer. Thus, we discussed that fertility sparing staging would be appropriate.   The patient had multiple tumor markers drawn at her OB/GYN's office. Only her LDH has resulted. I asked my office to contact her GYN to see if these results may be available but just not within our epic system.  With regard  to fertility, the patient is unsure regarding her plans but would like to maintain fertility for the future. The patient is sexually active - discussed using a condom or avoiding intercourse from now until surgery.  A copy of this note was sent to the patient's referring provider.   65 minutes of total time was spent for this patient encounter, including preparation, face-to-face counseling with the patient and coordination of care, and documentation of the encounter.   Jeral Pinch, MD  Division of Gynecologic Oncology  Department of Obstetrics and Gynecology  [facility]  ___________________________________________  Chief Complaint: Chief Complaint  Patient presents with   Pelvic mass    History of Present Illness:  Sherry Ryan is a 25 y.o. y.o. female who is seen in consultation at the request of Dr. Dione Plover for an evaluation of large abdominal pelvic cystic mass.  Patient reports that during her pregnancy with her son in 2017, she had a mass on one of her ovaries.  She does not remember the size but remembers being told that it was big and needed to be followed up after delivery.  She never went back for follow-up.  More recently, she was working on weight loss and successfully lost about 55 pounds.  She noticed decreased size with the exception of her abdomen which kept increasing in size.  She notes occasional light cramping menstrual-like even when she is not menstruating, otherwise denies abdominal or pelvic pain.  She notes bowel function is normal and at baseline.  She does not take anything to help with bowel function.  She denies any urinary symptoms including increasing frequency. She reports having a good appetite with occasional early satiety.  She denies any nausea or emesis.  Patient lives in [place] with her mother, siblings, and her 2 children, her 46-year-old daughter and 47-year-old son.  She works at [employer] job.  She denies  any tobacco or alcohol use.  She is currently sexually active, not on birth control.  Does not use condoms.  She thinks that her maternal grandmother may have had breast cancer.  Her mother has had multiple surgeries for ovarian cysts, none of these were cancerous.  PAST MEDICAL HISTORY:  History reviewed. No pertinent past medical history.   PAST SURGICAL HISTORY:  History reviewed. No pertinent surgical history.  OB/GYN HISTORY:  OB History  Gravida Para Term Preterm AB Living  2 2 2  0 0 2  SAB IAB Ectopic Multiple Live Births  0 0 0 0 2    # Outcome Date GA Lbr Len/2nd Weight Sex Delivery Anes PTL Lv  2 Term 11/28/16   7 lb 10 oz (3.459 kg) M Vag-Spont   LIV  1 Term 10/22/14   6 lb 12 oz (3.062 kg) F Vag-Spont   LIV    Patient's last menstrual period was 10/03/2021 (exact date).  Age at menarche: 55 Age at menopause: Not applicable Hx of HRT: Not applicable Hx of STDs: Yes, previously treated Last pap: 10/07/21 - pending History of abnormal pap smears: Denies  SCREENING STUDIES:  Last mammogram: Not applicable  Last colonoscopy: Not applicable  MEDICATIONS: Outpatient Encounter Medications as of 10/10/2021  Medication Sig   ibuprofen (ADVIL) 800 MG tablet Take 1 tablet (800 mg total) by mouth every 8 (eight) hours as needed for moderate pain. For AFTER surgery only   oxyCODONE (OXY IR/ROXICODONE) 5 MG immediate release tablet Take 1 tablet (5 mg total) by mouth every 4 (four) hours as needed for severe pain. For AFTER surgery only, do not take and drive   senna-docusate (SENOKOT-S) 8.6-50 MG tablet Take 2 tablets by mouth at bedtime. For AFTER surgery, do not take if having diarrhea   No facility-administered encounter medications on file as of 10/10/2021.    ALLERGIES:  No Known Allergies   FAMILY HISTORY:  Family History  Problem Relation Age of Onset   Healthy Mother    Healthy Father    Breast cancer Maternal Grandmother    Colon cancer Neg Hx    Ovarian  cancer Neg Hx    Endometrial cancer Neg Hx    Pancreatic cancer Neg Hx    Prostate cancer Neg Hx      SOCIAL HISTORY:  Social Connections: Not on file    REVIEW OF SYSTEMS:  Denies appetite changes, fevers, chills, fatigue, unexplained weight changes. Denies hearing loss, neck lumps or masses, mouth sores, ringing in ears or voice changes. Denies cough or wheezing.  Denies shortness of breath. Denies chest pain or palpitations. Denies leg swelling. Denies abdominal distention, pain, blood in stools, constipation, diarrhea, nausea, vomiting, or early satiety. Denies pain with intercourse, dysuria, frequency, hematuria or incontinence. Denies hot flashes, pelvic pain, vaginal bleeding or vaginal discharge.   Denies joint pain, back pain or muscle pain/cramps. Denies itching, rash, or wounds. Denies dizziness, headaches, numbness or seizures. Denies swollen lymph nodes or glands, denies easy bruising or bleeding. Denies anxiety, depression, confusion, or decreased concentration.  Physical Exam:  Vital Signs for this encounter:  Blood pressure (!) 138/92, pulse 91, temperature 98.5 F (36.9  C), temperature source Oral, resp. rate 16, height 6\' 1"  (1.854 m), weight 204 lb (92.5 kg), last menstrual period 10/03/2021, SpO2 100 %. Body mass index is 26.91 kg/m. General: Alert, oriented, no acute distress.  HEENT: Normocephalic, atraumatic. Sclera anicteric.  Chest: Clear to auscultation bilaterally. No wheezes, rhonchi, or rales. Cardiovascular: Regular rate and rhythm, no murmurs, rubs, or gallops.  Abdomen: Normoactive bowel sounds.  Abdomen is mildly tense, distended, nontender to palpation.  No palpable fluid wave although cystic mass able to be compressed.   Extremities: Grossly normal range of motion. Warm, well perfused. No edema bilaterally.  Skin: No rashes or lesions.  Lymphatics: No cervical, supraclavicular, or inguinal adenopathy.  GU:  Normal external female genitalia. No  lesions. No discharge or bleeding.             Bladder/urethra:  No lesions or masses, well supported bladder             Vagina: No lesions, well rugated, minimal physiologic discharge.             Cervix: Normal appearing, no lesions.             Uterus: Small, mobile, no parametrial involvement or nodularity.             Adnexa: Large cystic mass filling the pelvic and abdominal cavity, difficult to appreciate within the posterior cul-de-sac.  Mobile although limited mobility secondary to its size.  LABORATORY AND RADIOLOGIC DATA:  Outside medical records were reviewed to synthesize the above history, along with the history and physical obtained during the visit.   LDH: 186  Labs pending: CEA, Inhibin B, hcg, AFP, CA-125  No results found for: WBC, HGB, HCT, PLT, GLUCOSE, CHOL, TRIG, HDL, LDLDIRECT, LDLCALC, ALT, AST, NA, K, CL, CREATININE, BUN, CO2, TSH, PSA, INR, GLUF, HGBA1C, MICROALBUR  CT A/P on 09/23/21: IMPRESSION: 1. 35.7 x 23.1 x 16.7 cm multiloculated cystic mass in the abdomen and pelvis generates substantial mass-effect on bowel loops and retroperitoneal anatomy of the abdomen and pelvis. 2 soft tissue nodules are identified in the cranial most component of the multicystic mass. Imaging features concerning for neoplasm. Origin of this lesion is not discernible by CT imaging although ovarian etiology would be a distinct consideration. Mesenteric origin not excluded. 2. No ascites or lymphadenopathy.  Pelvic ultrasound on 08/18/21: IMPRESSION: Large complex cystic lesion extending from the superior abdomen to the lower pelvis with multiple septations and a solid component, possibly arising from the right ovary. Finding is concerning for neoplasm. Recommend contrast-enhanced CT of the abdomen and pelvis for further evaluation.

## 2021-10-10 NOTE — H&P (View-Only) (Signed)
GYNECOLOGIC ONCOLOGY NEW PATIENT CONSULTATION   Patient Name: Sherry Ryan  Patient Age: 25 y.o. Date of Service: 10/10/21 Referring Provider: Dr. Clayton Lefort  Primary Care Provider: Audley Hose, MD Consulting Provider: Jeral Pinch, MD   Assessment/Plan:  Premenopausal patient with most cystic large abdominopelvic mass.    I discussed with the patient her work up this far. We looked at her CT images together, which show a very large cystic mass with multiple septations. Given size of the mass as well as suspected existence back in 2017 at the time of her last pregnancy, I favor that this is either a benign ovarian mass or borderline tumor. We reviewed that the only way to make a definitive diagnosis is to proceed with surgery for excision. Given that size, it would likely not be feasible to remove intact. My recommendation is to proceed with mini laparotomy for controlled cyst drainage followed by robotic excision of the mass, likely unilateral salpingo-oophorectomy. Will plan to send the mass for frozen section. If results show benign mass, then no additional surgery will be performed. In the setting of a borderline tumor, we discussed that given her young age, as long as other fallopian tube and ovary as well as uterus and cervix were normal in appearance, they would be left in situ (with plan for completion surgery in the future). In the setting of malignancy identified on frozen section, then additional staging procedure such as lymph node biopsy and omentectomy would be performed based on histology. It is unlikely that in a patient this age she would have epithelial ovarian cancer. Thus, we discussed that fertility sparing staging would be appropriate.   The patient had multiple tumor markers drawn at her OB/GYN's office. Only her LDH has resulted. I asked my office to contact her GYN to see if these results may be available but just not within our epic system.  With regard  to fertility, the patient is unsure regarding her plans but would like to maintain fertility for the future. The patient is sexually active - discussed using a condom or avoiding intercourse from now until surgery.  A copy of this note was sent to the patient's referring provider.   65 minutes of total time was spent for this patient encounter, including preparation, face-to-face counseling with the patient and coordination of care, and documentation of the encounter.   Jeral Pinch, MD  Division of Gynecologic Oncology  Department of Obstetrics and Gynecology  [facility]  ___________________________________________  Chief Complaint: Chief Complaint  Patient presents with   Pelvic mass    History of Present Illness:  Sherry Ryan is a 25 y.o. y.o. female who is seen in consultation at the request of Dr. Dione Plover for an evaluation of large abdominal pelvic cystic mass.  Patient reports that during her pregnancy with her son in 2017, she had a mass on one of her ovaries.  She does not remember the size but remembers being told that it was big and needed to be followed up after delivery.  She never went back for follow-up.  More recently, she was working on weight loss and successfully lost about 55 pounds.  She noticed decreased size with the exception of her abdomen which kept increasing in size.  She notes occasional light cramping menstrual-like even when she is not menstruating, otherwise denies abdominal or pelvic pain.  She notes bowel function is normal and at baseline.  She does not take anything to help with bowel function.  She denies any urinary symptoms including increasing frequency. She reports having a good appetite with occasional early satiety.  She denies any nausea or emesis.  Patient lives in [place] with her mother, siblings, and her 2 children, her 75-year-old daughter and 27-year-old son.  She works at [employer] job.  She denies  any tobacco or alcohol use.  She is currently sexually active, not on birth control.  Does not use condoms.  She thinks that her maternal grandmother may have had breast cancer.  Her mother has had multiple surgeries for ovarian cysts, none of these were cancerous.  PAST MEDICAL HISTORY:  History reviewed. No pertinent past medical history.   PAST SURGICAL HISTORY:  History reviewed. No pertinent surgical history.  OB/GYN HISTORY:  OB History  Gravida Para Term Preterm AB Living  2 2 2  0 0 2  SAB IAB Ectopic Multiple Live Births  0 0 0 0 2    # Outcome Date GA Lbr Len/2nd Weight Sex Delivery Anes PTL Lv  2 Term 11/28/16   7 lb 10 oz (3.459 kg) M Vag-Spont   LIV  1 Term 10/22/14   6 lb 12 oz (3.062 kg) F Vag-Spont   LIV    Patient's last menstrual period was 10/03/2021 (exact date).  Age at menarche: 28 Age at menopause: Not applicable Hx of HRT: Not applicable Hx of STDs: Yes, previously treated Last pap: 10/07/21 - pending History of abnormal pap smears: Denies  SCREENING STUDIES:  Last mammogram: Not applicable  Last colonoscopy: Not applicable  MEDICATIONS: Outpatient Encounter Medications as of 10/10/2021  Medication Sig   ibuprofen (ADVIL) 800 MG tablet Take 1 tablet (800 mg total) by mouth every 8 (eight) hours as needed for moderate pain. For AFTER surgery only   oxyCODONE (OXY IR/ROXICODONE) 5 MG immediate release tablet Take 1 tablet (5 mg total) by mouth every 4 (four) hours as needed for severe pain. For AFTER surgery only, do not take and drive   senna-docusate (SENOKOT-S) 8.6-50 MG tablet Take 2 tablets by mouth at bedtime. For AFTER surgery, do not take if having diarrhea   No facility-administered encounter medications on file as of 10/10/2021.    ALLERGIES:  No Known Allergies   FAMILY HISTORY:  Family History  Problem Relation Age of Onset   Healthy Mother    Healthy Father    Breast cancer Maternal Grandmother    Colon cancer Neg Hx    Ovarian  cancer Neg Hx    Endometrial cancer Neg Hx    Pancreatic cancer Neg Hx    Prostate cancer Neg Hx      SOCIAL HISTORY:  Social Connections: Not on file    REVIEW OF SYSTEMS:  Denies appetite changes, fevers, chills, fatigue, unexplained weight changes. Denies hearing loss, neck lumps or masses, mouth sores, ringing in ears or voice changes. Denies cough or wheezing.  Denies shortness of breath. Denies chest pain or palpitations. Denies leg swelling. Denies abdominal distention, pain, blood in stools, constipation, diarrhea, nausea, vomiting, or early satiety. Denies pain with intercourse, dysuria, frequency, hematuria or incontinence. Denies hot flashes, pelvic pain, vaginal bleeding or vaginal discharge.   Denies joint pain, back pain or muscle pain/cramps. Denies itching, rash, or wounds. Denies dizziness, headaches, numbness or seizures. Denies swollen lymph nodes or glands, denies easy bruising or bleeding. Denies anxiety, depression, confusion, or decreased concentration.  Physical Exam:  Vital Signs for this encounter:  Blood pressure (!) 138/92, pulse 91, temperature 98.5 F (36.9  C), temperature source Oral, resp. rate 16, height 6\' 1"  (1.854 m), weight 204 lb (92.5 kg), last menstrual period 10/03/2021, SpO2 100 %. Body mass index is 26.91 kg/m. General: Alert, oriented, no acute distress.  HEENT: Normocephalic, atraumatic. Sclera anicteric.  Chest: Clear to auscultation bilaterally. No wheezes, rhonchi, or rales. Cardiovascular: Regular rate and rhythm, no murmurs, rubs, or gallops.  Abdomen: Normoactive bowel sounds.  Abdomen is mildly tense, distended, nontender to palpation.  No palpable fluid wave although cystic mass able to be compressed.   Extremities: Grossly normal range of motion. Warm, well perfused. No edema bilaterally.  Skin: No rashes or lesions.  Lymphatics: No cervical, supraclavicular, or inguinal adenopathy.  GU:  Normal external female genitalia. No  lesions. No discharge or bleeding.             Bladder/urethra:  No lesions or masses, well supported bladder             Vagina: No lesions, well rugated, minimal physiologic discharge.             Cervix: Normal appearing, no lesions.             Uterus: Small, mobile, no parametrial involvement or nodularity.             Adnexa: Large cystic mass filling the pelvic and abdominal cavity, difficult to appreciate within the posterior cul-de-sac.  Mobile although limited mobility secondary to its size.  LABORATORY AND RADIOLOGIC DATA:  Outside medical records were reviewed to synthesize the above history, along with the history and physical obtained during the visit.   LDH: 186  Labs pending: CEA, Inhibin B, hcg, AFP, CA-125  No results found for: WBC, HGB, HCT, PLT, GLUCOSE, CHOL, TRIG, HDL, LDLDIRECT, LDLCALC, ALT, AST, NA, K, CL, CREATININE, BUN, CO2, TSH, PSA, INR, GLUF, HGBA1C, MICROALBUR  CT A/P on 09/23/21: IMPRESSION: 1. 35.7 x 23.1 x 16.7 cm multiloculated cystic mass in the abdomen and pelvis generates substantial mass-effect on bowel loops and retroperitoneal anatomy of the abdomen and pelvis. 2 soft tissue nodules are identified in the cranial most component of the multicystic mass. Imaging features concerning for neoplasm. Origin of this lesion is not discernible by CT imaging although ovarian etiology would be a distinct consideration. Mesenteric origin not excluded. 2. No ascites or lymphadenopathy.  Pelvic ultrasound on 08/18/21: IMPRESSION: Large complex cystic lesion extending from the superior abdomen to the lower pelvis with multiple septations and a solid component, possibly arising from the right ovary. Finding is concerning for neoplasm. Recommend contrast-enhanced CT of the abdomen and pelvis for further evaluation.

## 2021-10-11 ENCOUNTER — Telehealth: Payer: Self-pay | Admitting: Gynecologic Oncology

## 2021-10-11 LAB — CYTOLOGY - PAP
Chlamydia: NEGATIVE
Comment: NEGATIVE
Comment: NEGATIVE
Comment: NEGATIVE
Comment: NORMAL
Diagnosis: NEGATIVE
Diagnosis: REACTIVE
High risk HPV: NEGATIVE
Neisseria Gonorrhea: NEGATIVE
Trichomonas: NEGATIVE

## 2021-10-11 NOTE — Telephone Encounter (Signed)
Called the patient to discuss normal tumor markers from her GYN visit last week.  No answer on home phone, no voicemail.  Jeral Pinch MD Gynecologic Oncology

## 2021-10-11 NOTE — Patient Instructions (Signed)
Preparing for your Surgery   Plan for surgery on October 20, 2021 with Dr. Jeral Pinch at [facility]. You will be scheduled for a mini laparotomy for cyst decompression, robotic assisted unilateral salpingo-oophorectomy (removal of one ovary and fallopian tube), mass excision, possible fertility sparing staging if a cancer is identified.    Pre-operative Testing -You will receive a phone call from presurgical testing at [facility] to arrange for a pre-operative appointment and lab work.   -Bring your insurance card, copy of an advanced directive if applicable, medication list   -At that visit, you will be asked to sign a consent for a possible blood transfusion in case a transfusion becomes necessary during surgery.  The need for a blood transfusion is rare but having consent is a necessary part of your care.      -You should not be taking blood thinners or aspirin at least ten days prior to surgery unless instructed by your surgeon.   -Do not take supplements such as fish oil (omega 3), red yeast rice, turmeric before your surgery. You want to avoid medications with aspirin in them including headache powders such as BC or Goody's), Excedrin migraine.   Day Before Surgery at [place] will be asked to take in a light diet the day before surgery. You will be advised you can have clear liquids up until 3 hours before your surgery.     Eat a light diet the day before surgery.  Examples including soups, broths, toast, yogurt, mashed potatoes.  AVOID GAS PRODUCING FOODS. Things to avoid include carbonated beverages (fizzy beverages, sodas), raw fruits and raw vegetables (uncooked), or beans.    If your bowels are filled with gas, your surgeon will have difficulty visualizing your pelvic organs which increases your surgical risks.   Your role in recovery Your role is to become active as soon as directed by your doctor, while still giving  yourself time to heal.  Rest when you feel tired. You will be asked to do the following in order to speed your recovery:   - Cough and breathe deeply. This helps to clear and expand your lungs and can prevent pneumonia after surgery.  - [place]. Do mild physical activity. Walking or moving your legs help your circulation and body functions return to normal. Do not try to get up or walk alone the first time after surgery.   -If you develop swelling on one leg or the other, pain in the back of your leg, redness/warmth in one of your legs, please call the office or go to the Emergency Room to have a doppler to rule out a blood clot. For shortness of breath, chest pain-seek care in the Emergency Room as soon as possible. - Actively manage your pain. Managing your pain lets you move in comfort. We will ask you to rate your pain on a scale of zero to 10. It is your responsibility to tell your doctor or nurse where and how much you hurt so your pain can be treated.   Special Considerations -Your final pathology results from surgery should be available around one week after surgery and the results will be relayed to you when available.   -Dr. Lahoma Crocker is the surgeon that assists your GYN Oncologist with surgery.  If you end up staying the night, the next day after your surgery you will either see Dr. Denman George, Dr. Berline Lopes, or Dr. Lahoma Crocker.   -  FMLA forms can be faxed to [phone] and please allow 5-7 business days for completion.   Pain Management After Surgery -You have been prescribed your pain medication and bowel regimen medications before surgery so that you can have these available when you are discharged from the hospital. The pain medication is for use ONLY AFTER surgery and a new prescription will not be given.    -Make sure that you have Tylenol and Ibuprofen at home to use on a regular basis after surgery for pain control. We recommend alternating the  medications every hour to six hours since they work differently and are processed in the body differently for pain relief.   -Review the attached handout on narcotic use and their risks and side effects.    Bowel Regimen -You have been prescribed Sennakot-S to take nightly to prevent constipation especially if you are taking the narcotic pain medication intermittently.  It is important to prevent constipation and drink adequate amounts of liquids. You can stop taking this medication when you are not taking pain medication and you are back on your normal bowel routine.   Risks of Surgery Risks of surgery are low but include bleeding, infection, damage to surrounding structures, re-operation, blood clots, and very rarely death.     Blood Transfusion Information (For the consent to be signed before surgery)   We will be checking your blood type before surgery so in case of emergencies, we will know what type of blood you would need.                                             WHAT IS A BLOOD TRANSFUSION?   A transfusion is the replacement of blood or some of its parts. Blood is made up of multiple cells which provide different functions. Red blood cells carry oxygen and are used for blood loss replacement. White blood cells fight against infection. Platelets control bleeding. Plasma helps clot blood. Other blood products are available for specialized needs, such as hemophilia or other clotting disorders. BEFORE THE TRANSFUSION  Who gives blood for transfusions?  You may be able to donate blood to be used at a later date on yourself (autologous donation). Relatives can be asked to donate blood. This is generally not any safer than if you have received blood from a stranger. The same precautions are taken to ensure safety when a relative's blood is donated. Healthy volunteers who are fully evaluated to make sure their blood is safe. This is blood bank blood. Transfusion therapy is the safest  it has ever been in the practice of medicine. Before blood is taken from a donor, a complete history is taken to make sure that person has no history of diseases nor engages in risky social behavior (examples are intravenous drug use or sexual activity with multiple partners). The donor's travel history is screened to minimize risk of transmitting infections, such as malaria. The donated blood is tested for signs of infectious diseases, such as HIV and hepatitis. The blood is then tested to be sure it is compatible with you in order to minimize the chance of a transfusion reaction. If you or a relative donates blood, this is often done in anticipation of surgery and is not appropriate for emergency situations. It takes many days to process the donated blood. RISKS AND COMPLICATIONS Although transfusion therapy is very safe and  saves many lives, the main dangers of transfusion include:  Getting an infectious disease. Developing a transfusion reaction. This is an allergic reaction to something in the blood you were given. Every precaution is taken to prevent this. The decision to have a blood transfusion has been considered carefully by your caregiver before blood is given. Blood is not given unless the benefits outweigh the risks.   AFTER SURGERY INSTRUCTIONS   Return to work: 4-6 weeks if applicable   You will have a white honeycomb dressing over your larger incision. This dressing can be removed 5 days after surgery and you do not need to reapply a new dressing. Once you remove the dressing, you will notice that you have the surgical glue (dermabond) on the incision and this will peel off on its own. You can get this dressing wet in the shower the days after surgery prior to removal on the 5th day.    Activity: 1. Be up and out of the bed during the day.  Take a nap if needed.  You may walk up steps but be careful and use the hand rail.  Stair climbing will tire you more than you think, you may need  to stop part way and rest.    2. No lifting or straining for 6 weeks over 10 pounds. No pushing, pulling, straining for 6 weeks.   3. No driving for 1 week(s).  Do not drive if you are taking narcotic pain medicine and make sure that your reaction time has returned.    4. You can shower as soon as the next day after surgery. Shower daily.  Use your regular soap and water (not directly on the incision) and pat your incision(s) dry afterwards; don't rub.  No tub baths or submerging your body in water until cleared by your surgeon. If you have the soap that was given to you by pre-surgical testing that was used before surgery, you do not need to use it afterwards because this can irritate your incisions.    5. No sexual activity and nothing in the vagina for 4 weeks.   6. You may experience a small amount of clear drainage from your incisions, which is normal.  If the drainage persists, increases, or changes color please call the office.   7. Do not use creams, lotions, or ointments such as neosporin on your incisions after surgery until advised by your surgeon because they can cause removal of the dermabond glue on your incisions.     8. You may experience vaginal spotting after surgery.  The spotting is normal but if you experience heavy bleeding, call our office.   9. Take Tylenol or ibuprofen first for pain and only use narcotic pain medication for severe pain not relieved by the Tylenol or Ibuprofen.  Monitor your Tylenol intake to a max of 4,000 mg in a 24 hour period. You can alternate these medications after surgery.   Diet: 1. Low sodium Heart Healthy Diet is recommended but you are cleared to resume your normal (before surgery) diet after your procedure.   2. It is safe to use a laxative, such as Miralax or Colace, if you have difficulty moving your bowels. You have been prescribed Sennakot at bedtime every evening to keep bowel movements regular and to prevent constipation.     Wound  Care: 1. Keep clean and dry.  Shower daily.   Reasons to call the Doctor: Fever - Oral temperature greater than 100.4 degrees Fahrenheit Foul-smelling vaginal  discharge Difficulty urinating Nausea and vomiting Increased pain at the site of the incision that is unrelieved with pain medicine. Difficulty breathing with or without chest pain New calf pain especially if only on one side Sudden, continuing increased vaginal bleeding with or without clots.   Contacts: For questions or concerns you should contact:   Dr. Jeral Pinch at [phone]   Joylene John, NP at [phone]   After Hours: call [phone] and have the GYN Oncologist paged/contacted (after 5 pm or on the weekends).   Messages sent via mychart are for non-urgent matters and are not responded to after hours so for urgent needs, please call the after hours number.

## 2021-10-11 NOTE — Progress Notes (Signed)
Patient here for new patient consultation with Dr. Jeral Pinch and for a pre-operative discussion prior to her scheduled surgery on October 20, 2021. She is scheduled for mini laparotomy for cyst decompression, robotic assisted unilateral salpingo-oophorectomy, mass excision, possible fertility sparing staging if a cancer is identified. The surgery was discussed in detail.  See after visit summary for additional details. Visual aids used to discuss items related to surgery including sequential compression stockings, foley catheter, IV pump, multi-modal pain regimen including tylenol, photo of the surgical robot, female reproductive system to discuss surgery in detail.      Discussed post-op pain management in detail including the aspects of the enhanced recovery pathway.  Advised her that a new prescription would be sent in for oxycodone and it is only to be used for after her upcoming surgery.  We discussed the use of tylenol post-op and to monitor for a maximum of 4,000 mg in a 24 hour period.  Also prescribed sennakot to be used after surgery and to hold if having loose stools.  Discussed bowel regimen in detail.     Discussed the use of heparin pre-op, SCDs, and measures to take at home to prevent DVT including frequent mobility.  Reportable signs and symptoms of DVT discussed. Post-operative instructions discussed and expectations for after surgery. Incisional care discussed as well including reportable signs and symptoms including erythema, drainage, wound separation.     5 minutes spent with the patient. Verbalizing understanding of material discussed. No needs or concerns voiced at the end of the visit.   Advised patient to call for any needs.  Advised that her post-operative medications had been prescribed and could be picked up at any time.    This appointment is included in the global surgical bundle as pre-operative teaching and has no charge.

## 2021-10-12 ENCOUNTER — Telehealth: Payer: Self-pay | Admitting: Gynecologic Oncology

## 2021-10-12 NOTE — Telephone Encounter (Signed)
Called the patient with lab results from recent [place] office.  These were all normal.  Patient happy with this news.  All questions answered.  Jeral Pinch MD Gynecologic Oncology

## 2021-10-13 ENCOUNTER — Other Ambulatory Visit: Payer: Self-pay

## 2021-10-13 ENCOUNTER — Encounter (HOSPITAL_BASED_OUTPATIENT_CLINIC_OR_DEPARTMENT_OTHER): Payer: Self-pay | Admitting: Gynecologic Oncology

## 2021-10-13 NOTE — Progress Notes (Signed)
Spoke w/ via phone for pre-op interview--- [employer]----   UPT. Lab appt 11/1 @ 0900            Lab results------ COVID test -----patient states asymptomatic no test needed Arrive at ------- 3403 NPO after MN NO Solid Food.  Clear liquids from MN until--- 1045 Med rec completed Medications to take morning of surgery ----- NONE Diabetic medication ----- Patient instructed no nail polish to be worn day of surgery Patient instructed to bring photo id and insurance card day of surgery Patient aware to have Driver (ride ) / caregiver    for 24 hours after surgery  Patient Special Instructions ----- Pre-Op special Istructions ----- Patient verbalized understanding of instructions that were given at this phone interview. Patient denies shortness of breath, chest pain, fever, cough at this phone interview.

## 2021-10-18 ENCOUNTER — Encounter (HOSPITAL_COMMUNITY)
Admission: RE | Admit: 2021-10-18 | Discharge: 2021-10-18 | Disposition: A | Discharge status: Home / Self Care | Payer: Medicaid Other | Source: Ambulatory Visit | Attending: Gynecologic Oncology | Admitting: Gynecologic Oncology

## 2021-10-18 ENCOUNTER — Other Ambulatory Visit: Payer: Self-pay

## 2021-10-18 DIAGNOSIS — R1900 Intra-abdominal and pelvic swelling, mass and lump, unspecified site: Secondary | ICD-10-CM | POA: Insufficient documentation

## 2021-10-18 DIAGNOSIS — Z01812 Encounter for preprocedural laboratory examination: Secondary | ICD-10-CM | POA: Diagnosis present

## 2021-10-18 LAB — CBC
HCT: 34.7 % — ABNORMAL LOW (ref 36.0–46.0)
Hemoglobin: 10.6 g/dL — ABNORMAL LOW (ref 12.0–15.0)
MCH: 21.5 pg — ABNORMAL LOW (ref 26.0–34.0)
MCHC: 30.5 g/dL (ref 30.0–36.0)
MCV: 70.5 fL — ABNORMAL LOW (ref 80.0–100.0)
Platelets: 298 10*3/uL (ref 150–400)
RBC: 4.92 MIL/uL (ref 3.87–5.11)
RDW: 14.8 % (ref 11.5–15.5)
WBC: 4.5 10*3/uL (ref 4.0–10.5)
nRBC: 0 % (ref 0.0–0.2)

## 2021-10-18 LAB — COMPREHENSIVE METABOLIC PANEL
ALT: 14 U/L (ref 0–44)
AST: 17 U/L (ref 15–41)
Albumin: 3.7 g/dL (ref 3.5–5.0)
Alkaline Phosphatase: 40 U/L (ref 38–126)
Anion gap: 6 (ref 5–15)
BUN: 14 mg/dL (ref 6–20)
CO2: 21 mmol/L — ABNORMAL LOW (ref 22–32)
Calcium: 8.5 mg/dL — ABNORMAL LOW (ref 8.9–10.3)
Chloride: 107 mmol/L (ref 98–111)
Creatinine, Ser: 0.73 mg/dL (ref 0.44–1.00)
GFR, Estimated: >60 mL/min (ref >60)
Glucose, Bld: 79 mg/dL (ref 70–99)
Potassium: 3.8 mmol/L (ref 3.5–5.1)
Sodium: 134 mmol/L — ABNORMAL LOW (ref 135–145)
Total Bilirubin: 0.6 mg/dL (ref 0.3–1.2)
Total Protein: 7 g/dL (ref 6.5–8.1)

## 2021-10-19 ENCOUNTER — Telehealth: Payer: Self-pay

## 2021-10-19 NOTE — Progress Notes (Signed)
Spoke with [place] regarding arrival time change for 10/20/2021. Advised to arrive at 1115. Verbalized understanding.

## 2021-10-19 NOTE — Telephone Encounter (Signed)
Telephone call to check on pre-operative status.  Patient compliant with pre-operative instructions.  Reinforced NPO after midnight.  No questions or concerns voiced.  Instructed to call for any needs.   

## 2021-10-20 ENCOUNTER — Ambulatory Visit (HOSPITAL_BASED_OUTPATIENT_CLINIC_OR_DEPARTMENT_OTHER): Payer: Medicaid Other | Admitting: Anesthesiology

## 2021-10-20 ENCOUNTER — Encounter (HOSPITAL_BASED_OUTPATIENT_CLINIC_OR_DEPARTMENT_OTHER): Payer: Self-pay | Admitting: Gynecologic Oncology

## 2021-10-20 ENCOUNTER — Other Ambulatory Visit: Payer: Self-pay

## 2021-10-20 ENCOUNTER — Encounter (HOSPITAL_BASED_OUTPATIENT_CLINIC_OR_DEPARTMENT_OTHER)
Admission: RE | Disposition: A | Discharge status: Home / Self Care | Payer: Self-pay | Source: Ambulatory Visit | Attending: Gynecologic Oncology

## 2021-10-20 ENCOUNTER — Ambulatory Visit (HOSPITAL_BASED_OUTPATIENT_CLINIC_OR_DEPARTMENT_OTHER)
Admission: RE | Admit: 2021-10-20 | Discharge: 2021-10-20 | Disposition: A | Discharge status: Home / Self Care | Payer: Medicaid Other | Source: Ambulatory Visit | Attending: Gynecologic Oncology | Admitting: Gynecologic Oncology

## 2021-10-20 DIAGNOSIS — R1900 Intra-abdominal and pelvic swelling, mass and lump, unspecified site: Secondary | ICD-10-CM

## 2021-10-20 DIAGNOSIS — D271 Benign neoplasm of left ovary: Secondary | ICD-10-CM

## 2021-10-20 HISTORY — PX: LAPAROTOMY: SHX154

## 2021-10-20 HISTORY — PX: ROBOTIC ASSISTED BILATERAL SALPINGO OOPHERECTOMY: SHX6078

## 2021-10-20 HISTORY — DX: Other specified health status: Z78.9

## 2021-10-20 LAB — POCT PREGNANCY, URINE: Preg Test, Ur: NEGATIVE

## 2021-10-20 LAB — TYPE AND SCREEN
ABO/RH(D): A POS
Antibody Screen: NEGATIVE

## 2021-10-20 LAB — ABO/RH: ABO/RH(D): A POS

## 2021-10-20 SURGERY — LAPAROTOMY
Anesthesia: General | Site: Abdomen

## 2021-10-20 MED ORDER — OXYCODONE HCL 5 MG PO TABS
5.0000 mg | ORAL_TABLET | Freq: Once | ORAL | Status: AC | PRN
  Administered 2021-10-20: 5 mg via ORAL

## 2021-10-20 MED ORDER — LACTATED RINGERS IV SOLN: INTRAVENOUS | Status: DC

## 2021-10-20 MED ORDER — ONDANSETRON HCL 4 MG/2ML IJ SOLN
INTRAMUSCULAR | Status: DC | PRN
  Administered 2021-10-20: 4 mg via INTRAVENOUS

## 2021-10-20 MED ORDER — KETOROLAC TROMETHAMINE 30 MG/ML IJ SOLN
INTRAMUSCULAR | Status: AC
  Filled 2021-10-20: qty 1

## 2021-10-20 MED ORDER — BUPIVACAINE HCL 0.25 % IJ SOLN
INTRAMUSCULAR | Status: DC | PRN
  Administered 2021-10-20: 20 mL
  Administered 2021-10-20: 10 mL

## 2021-10-20 MED ORDER — PROPOFOL 10 MG/ML IV BOLUS
INTRAVENOUS | Status: DC | PRN
  Administered 2021-10-20: 150 mg via INTRAVENOUS

## 2021-10-20 MED ORDER — DEXAMETHASONE SODIUM PHOSPHATE 10 MG/ML IJ SOLN: 4.0000 mg | INTRAMUSCULAR | Status: DC

## 2021-10-20 MED ORDER — BUPIVACAINE LIPOSOME 1.3 % IJ SUSP
INTRAMUSCULAR | Status: DC | PRN
  Administered 2021-10-20: 20 mL

## 2021-10-20 MED ORDER — MIDAZOLAM HCL 5 MG/5ML IJ SOLN
INTRAMUSCULAR | Status: DC | PRN
  Administered 2021-10-20: 2 mg via INTRAVENOUS

## 2021-10-20 MED ORDER — PHENYLEPHRINE 40 MCG/ML (10ML) SYRINGE FOR IV PUSH (FOR BLOOD PRESSURE SUPPORT)
PREFILLED_SYRINGE | INTRAVENOUS | Status: AC
  Filled 2021-10-20: qty 10

## 2021-10-20 MED ORDER — FENTANYL CITRATE (PF) 100 MCG/2ML IJ SOLN
INTRAMUSCULAR | Status: DC | PRN
  Administered 2021-10-20: 25 ug via INTRAVENOUS
  Administered 2021-10-20 (×3): 50 ug via INTRAVENOUS
  Administered 2021-10-20: 25 ug via INTRAVENOUS
  Administered 2021-10-20: 50 ug via INTRAVENOUS

## 2021-10-20 MED ORDER — DEXAMETHASONE SODIUM PHOSPHATE 4 MG/ML IJ SOLN
INTRAMUSCULAR | Status: DC | PRN
  Administered 2021-10-20: 10 mg via INTRAVENOUS

## 2021-10-20 MED ORDER — SODIUM CHLORIDE 0.9 % IR SOLN
Status: DC | PRN
  Administered 2021-10-20: 1000 mL

## 2021-10-20 MED ORDER — ACETAMINOPHEN 500 MG PO TABS
ORAL_TABLET | ORAL | Status: AC
  Filled 2021-10-20: qty 2

## 2021-10-20 MED ORDER — GABAPENTIN 300 MG PO CAPS
300.0000 mg | ORAL_CAPSULE | ORAL | Status: AC
  Administered 2021-10-20: 300 mg via ORAL

## 2021-10-20 MED ORDER — STERILE WATER FOR IRRIGATION IR SOLN
Status: DC | PRN
  Administered 2021-10-20: 1000 mL

## 2021-10-20 MED ORDER — GABAPENTIN 300 MG PO CAPS
ORAL_CAPSULE | ORAL | Status: AC
  Filled 2021-10-20: qty 1

## 2021-10-20 MED ORDER — ONDANSETRON HCL 4 MG/2ML IJ SOLN
INTRAMUSCULAR | Status: AC
  Filled 2021-10-20: qty 2

## 2021-10-20 MED ORDER — MIDAZOLAM HCL 2 MG/2ML IJ SOLN
INTRAMUSCULAR | Status: AC
  Filled 2021-10-20: qty 2

## 2021-10-20 MED ORDER — SUGAMMADEX SODIUM 200 MG/2ML IV SOLN
INTRAVENOUS | Status: DC | PRN
  Administered 2021-10-20: 200 mg via INTRAVENOUS

## 2021-10-20 MED ORDER — ACETAMINOPHEN 500 MG PO TABS
1000.0000 mg | ORAL_TABLET | ORAL | Status: AC
  Administered 2021-10-20: 1000 mg via ORAL

## 2021-10-20 MED ORDER — FENTANYL CITRATE (PF) 250 MCG/5ML IJ SOLN
INTRAMUSCULAR | Status: AC
  Filled 2021-10-20: qty 5

## 2021-10-20 MED ORDER — FENTANYL CITRATE (PF) 100 MCG/2ML IJ SOLN: 25.0000 ug | INTRAMUSCULAR | Status: DC | PRN

## 2021-10-20 MED ORDER — PROPOFOL 10 MG/ML IV BOLUS
INTRAVENOUS | Status: AC
  Filled 2021-10-20: qty 20

## 2021-10-20 MED ORDER — SCOPOLAMINE 1 MG/3DAYS TD PT72
MEDICATED_PATCH | TRANSDERMAL | Status: AC
  Filled 2021-10-20: qty 1

## 2021-10-20 MED ORDER — DEXAMETHASONE SODIUM PHOSPHATE 10 MG/ML IJ SOLN
INTRAMUSCULAR | Status: AC
  Filled 2021-10-20: qty 1

## 2021-10-20 MED ORDER — OXYCODONE HCL 5 MG PO TABS
ORAL_TABLET | ORAL | Status: AC
  Filled 2021-10-20: qty 1

## 2021-10-20 MED ORDER — CELECOXIB 200 MG PO CAPS
ORAL_CAPSULE | ORAL | Status: AC
  Filled 2021-10-20: qty 2

## 2021-10-20 MED ORDER — ROCURONIUM BROMIDE 100 MG/10ML IV SOLN
INTRAVENOUS | Status: DC | PRN
  Administered 2021-10-20: 80 mg via INTRAVENOUS

## 2021-10-20 MED ORDER — OXYCODONE HCL 5 MG/5ML PO SOLN: 5.0000 mg | Freq: Once | ORAL | Status: AC | PRN

## 2021-10-20 MED ORDER — HEPARIN SODIUM (PORCINE) 5000 UNIT/ML IJ SOLN
INTRAMUSCULAR | Status: AC
  Filled 2021-10-20: qty 1

## 2021-10-20 MED ORDER — AMISULPRIDE (ANTIEMETIC) 5 MG/2ML IV SOLN: 10.0000 mg | Freq: Once | INTRAVENOUS | Status: DC | PRN

## 2021-10-20 MED ORDER — HEMOSTATIC AGENTS (NO CHARGE) OPTIME
TOPICAL | Status: DC | PRN
  Administered 2021-10-20: 1 via TOPICAL

## 2021-10-20 MED ORDER — CELECOXIB 200 MG PO CAPS
400.0000 mg | ORAL_CAPSULE | ORAL | Status: AC
  Administered 2021-10-20: 400 mg via ORAL

## 2021-10-20 MED ORDER — HEPARIN SODIUM (PORCINE) 5000 UNIT/ML IJ SOLN
5000.0000 [IU] | INTRAMUSCULAR | Status: AC
  Administered 2021-10-20: 5000 [IU] via SUBCUTANEOUS

## 2021-10-20 MED ORDER — PROMETHAZINE HCL 25 MG/ML IJ SOLN: 6.2500 mg | INTRAMUSCULAR | Status: DC | PRN

## 2021-10-20 MED ORDER — LIDOCAINE 2% (20 MG/ML) 5 ML SYRINGE
INTRAMUSCULAR | Status: AC
  Filled 2021-10-20: qty 5

## 2021-10-20 MED ORDER — SCOPOLAMINE 1 MG/3DAYS TD PT72
1.0000 | MEDICATED_PATCH | TRANSDERMAL | Status: DC
  Administered 2021-10-20: 1.5 mg via TRANSDERMAL

## 2021-10-20 MED ORDER — POVIDONE-IODINE 10 % EX SWAB: 2.0000 "application " | Freq: Once | CUTANEOUS | Status: DC

## 2021-10-20 MED ORDER — CEFAZOLIN SODIUM-DEXTROSE 2-4 GM/100ML-% IV SOLN
2.0000 g | INTRAVENOUS | Status: AC
  Administered 2021-10-20: 2 g via INTRAVENOUS

## 2021-10-20 MED ORDER — ROCURONIUM BROMIDE 10 MG/ML (PF) SYRINGE
PREFILLED_SYRINGE | INTRAVENOUS | Status: AC
  Filled 2021-10-20: qty 10

## 2021-10-20 MED ORDER — CEFAZOLIN SODIUM-DEXTROSE 2-4 GM/100ML-% IV SOLN
INTRAVENOUS | Status: AC
  Filled 2021-10-20: qty 100

## 2021-10-20 MED ORDER — LIDOCAINE HCL (CARDIAC) PF 100 MG/5ML IV SOSY
PREFILLED_SYRINGE | INTRAVENOUS | Status: DC | PRN
  Administered 2021-10-20: 80 mg via INTRAVENOUS
  Administered 2021-10-20: 20 mg via INTRAVENOUS

## 2021-10-20 MED ORDER — PHENYLEPHRINE HCL (PRESSORS) 10 MG/ML IV SOLN
INTRAVENOUS | Status: DC | PRN
  Administered 2021-10-20: 80 ug via INTRAVENOUS

## 2021-10-20 SURGICAL SUPPLY — 75 items
ADH SKN CLS APL DERMABOND .7 (GAUZE/BANDAGES/DRESSINGS) ×1
AGENT HMST KT MTR STRL THRMB (HEMOSTASIS)
APL ESCP 34 STRL LF DISP (HEMOSTASIS)
APL SRG 38 LTWT LNG FL B (MISCELLANEOUS) ×1
APPLICATOR ARISTA FLEXITIP XL (MISCELLANEOUS) ×2 IMPLANT
APPLICATOR SURGIFLO ENDO (HEMOSTASIS) IMPLANT
BAG LAPAROSCOPIC 12 15 PORT 16 (BASKET) ×1 IMPLANT
BAG RETRIEVAL 12/15 (BASKET) ×2
BAG SPEC RTRVL LRG 6X4 10 (ENDOMECHANICALS)
BLADE SURG 10 STRL SS (BLADE) ×2 IMPLANT
COVER BACK TABLE 60X90IN (DRAPES) ×2 IMPLANT
COVER TIP SHEARS 8 DVNC (MISCELLANEOUS) ×1 IMPLANT
COVER TIP SHEARS 8MM DA VINCI (MISCELLANEOUS) ×1
DECANTER SPIKE VIAL GLASS SM (MISCELLANEOUS) IMPLANT
DERMABOND ADVANCED (GAUZE/BANDAGES/DRESSINGS) ×1
DERMABOND ADVANCED .7 DNX12 (GAUZE/BANDAGES/DRESSINGS) ×1 IMPLANT
DRAPE ARM DVNC X/XI (DISPOSABLE) ×4 IMPLANT
DRAPE COLUMN DVNC XI (DISPOSABLE) ×1 IMPLANT
DRAPE DA VINCI XI ARM (DISPOSABLE) ×4
DRAPE DA VINCI XI COLUMN (DISPOSABLE) ×1
DRAPE SHEET LG 3/4 BI-LAMINATE (DRAPES) ×2 IMPLANT
DRAPE SURG IRRIG POUCH 19X23 (DRAPES) ×2 IMPLANT
ELECT REM PT RETURN 9FT ADLT (ELECTROSURGICAL) ×2
ELECTRODE REM PT RTRN 9FT ADLT (ELECTROSURGICAL) ×1 IMPLANT
GAUZE 4X4 16PLY ~~LOC~~+RFID DBL (SPONGE) ×4 IMPLANT
GLOVE SURG ENC MOIS LTX SZ6 (GLOVE) ×8 IMPLANT
GLOVE SURG ENC MOIS LTX SZ6.5 (GLOVE) ×2 IMPLANT
HEMOSTAT ARISTA ABSORB 3G PWDR (HEMOSTASIS) ×2 IMPLANT
HOLDER FOLEY CATH W/STRAP (MISCELLANEOUS) ×2 IMPLANT
IRRIG SUCT STRYKERFLOW 2 WTIP (MISCELLANEOUS) ×2
IRRIGATION SUCT STRKRFLW 2 WTP (MISCELLANEOUS) ×1 IMPLANT
KIT PROCEDURE DA VINCI SI (MISCELLANEOUS)
KIT PROCEDURE DVNC SI (MISCELLANEOUS) IMPLANT
KIT TURNOVER CYSTO (KITS) ×2 IMPLANT
LEGGING LITHOTOMY PAIR STRL (DRAPES) ×2 IMPLANT
MANIPULATOR UTERINE 4.5 ZUMI (MISCELLANEOUS) ×2 IMPLANT
NDL SAFETY ECLIPSE 18X1.5 (NEEDLE) IMPLANT
NEEDLE HYPO 18GX1.5 SHARP (NEEDLE)
NEEDLE HYPO 22GX1.5 SAFETY (NEEDLE) ×2 IMPLANT
NEEDLE SPNL 18GX3.5 QUINCKE PK (NEEDLE) IMPLANT
OBTURATOR OPTICAL STANDARD 8MM (TROCAR) ×1
OBTURATOR OPTICAL STND 8 DVNC (TROCAR) ×1
OBTURATOR OPTICALSTD 8 DVNC (TROCAR) ×1 IMPLANT
PACK ROBOT GYN CUSTOM WL (TRAY / TRAY PROCEDURE) ×2 IMPLANT
PACK ROBOTIC GOWN (GOWN DISPOSABLE) ×2 IMPLANT
PAD POSITIONING PINK XL (MISCELLANEOUS) ×2 IMPLANT
PENCIL SMOKE EVACUATOR (MISCELLANEOUS) ×2 IMPLANT
PORT ACCESS TROCAR AIRSEAL 12 (TROCAR) ×1 IMPLANT
PORT ACCESS TROCAR AIRSEAL 5M (TROCAR) ×1
PORT LAP GEL ALEXIS MED 5-9CM (MISCELLANEOUS) ×2 IMPLANT
POUCH SPECIMEN RETRIEVAL 10MM (ENDOMECHANICALS) IMPLANT
SEAL CANN UNIV 5-8 DVNC XI (MISCELLANEOUS) ×4 IMPLANT
SEAL XI 5MM-8MM UNIVERSAL (MISCELLANEOUS) ×4
SET TRI-LUMEN FLTR TB AIRSEAL (TUBING) ×2 IMPLANT
SPONGE T-LAP 18X18 ~~LOC~~+RFID (SPONGE) ×2 IMPLANT
SURGIFLO W/THROMBIN 8M KIT (HEMOSTASIS) IMPLANT
SUT MNCRL AB 4-0 PS2 18 (SUTURE) IMPLANT
SUT PDS AB 0 CTX 60 (SUTURE) IMPLANT
SUT VIC AB 0 CT1 27 (SUTURE)
SUT VIC AB 0 CT1 27XBRD ANTBC (SUTURE) IMPLANT
SUT VIC AB 2-0 CT2 27 (SUTURE) IMPLANT
SUT VIC AB 2-0 SH 27 (SUTURE) ×8
SUT VIC AB 2-0 SH 27XBRD (SUTURE) ×4 IMPLANT
SUT VIC AB 3-0 SH 27 (SUTURE)
SUT VIC AB 3-0 SH 27X BRD (SUTURE) IMPLANT
SUT VIC AB 4-0 PS2 18 (SUTURE) ×4 IMPLANT
SYR 10ML LL (SYRINGE) IMPLANT
SYR BULB IRRIG 60ML STRL (SYRINGE) ×2 IMPLANT
SYR CONTROL 10ML LL (SYRINGE) ×4 IMPLANT
TRAP SPECIMEN MUCUS 40CC (MISCELLANEOUS) IMPLANT
TRAY FOLEY W/BAG SLVR 14FR LF (SET/KITS/TRAYS/PACK) ×2 IMPLANT
TUBE CONNECTING 12X1/4 (SUCTIONS) ×2 IMPLANT
UNDERPAD 30X36 HEAVY ABSORB (UNDERPADS AND DIAPERS) ×2 IMPLANT
WATER STERILE IRR 1000ML POUR (IV SOLUTION) ×2 IMPLANT
YANKAUER SUCT BULB TIP NO VENT (SUCTIONS) ×2 IMPLANT

## 2021-10-20 NOTE — Discharge Instructions (Addendum)
10/20/2021  Return to work: 4-6 weeks if applicable  Today Dr. Berline Lopes removed the left ovary and fallopian tube. The pathologist did not feel this was a cancer on frozen section and we will await the final path report in several days.  Activity: 1. Be up and out of the bed during the day.  Take a nap if needed.  You may walk up steps but be careful and use the hand rail.  Stair climbing will tire you more than you think, you may need to stop part way and rest.   2. No lifting or straining over 10 lbs, pushing, pulling, straining for 6 weeks.  3. No driving for 1 week(s).  Do not drive if you are taking narcotic pain medicine. You need to make sure your reaction time has returned and you can brake safely.  4. Shower daily.  Use your regular soap to bathe and when finished pat your incision dry; don't rub.  No tub baths until cleared by your surgeon.   5. No sexual activity and nothing in the vagina for 4 weeks.  6. You may experience a small amount of clear drainage from your incisions, which is normal.  If the drainage persists or increases, please call the office.  7. You may experience vaginal spotting after surgery. The spotting is normal but if you experience heavy bleeding, call our office.  8. Take Tylenol or ibuprofen first for pain and only use narcotic pain medication for severe pain not relieved by the Tylenol or Ibuprofen.  Monitor your Tylenol intake to a max of 4,000 mg.   Diet: 1. Low sodium Heart Healthy Diet is recommended.  2. It is safe to use a laxative, such as Miralax or Colace, if you have difficulty moving your bowels. You can take Sennakot at bedtime every evening to keep bowel movements regular and to prevent constipation.    Wound Care: 1. Keep clean and dry.  Shower daily.  Reasons to call the Doctor: Fever - Oral temperature greater than 100.4 degrees Fahrenheit Foul-smelling vaginal discharge Difficulty urinating Nausea and vomiting Increased pain at  the site of the incision that is unrelieved with pain medicine. Difficulty breathing with or without chest pain New calf pain especially if only on one side Sudden, continuing increased vaginal bleeding with or without clots.   Contacts: For questions or concerns you should contact:  Dr. Jeral Pinch at [phone]  Joylene John, NP at [phone]  After Hours: call [phone] and have the GYN Oncologist paged/contacted   Post Anesthesia Home Care Instructions  Activity: Get plenty of rest for the remainder of the day. A responsible adult should stay with you for 24 hours following the procedure.  For the next 24 hours, DO NOT: -Drive a car -Paediatric nurse -Drink alcoholic beverages -Take any medication unless instructed by your physician -Make any legal decisions or sign important papers.  Meals: Start with liquid foods such as gelatin or soup. Progress to regular foods as tolerated. Avoid greasy, spicy, heavy foods. If nausea and/or vomiting occur, drink only clear liquids until the nausea and/or vomiting subsides. Call your physician if vomiting continues.  Special Instructions/Symptoms: Your throat may feel dry or sore from the anesthesia or the breathing tube placed in your throat during surgery. If this causes discomfort, gargle with warm salt water. The discomfort should disappear within 24 hours.  If you had a scopolamine patch placed behind your ear for the management of post- operative nausea and/or vomiting:  1. The  medication in the patch is effective for 72 hours, after which it should be removed.  Wrap patch in a tissue and discard in the trash. Wash hands thoroughly with soap and water. 2. You may remove the patch earlier than 72 hours if you experience unpleasant side effects which may include dry mouth, dizziness or visual disturbances. 3. Avoid touching the patch. Wash your hands with soap and water after contact with the patch.   Information for  Discharge Teaching: EXPAREL (bupivacaine liposome injectable suspension)   Your surgeon gave you EXPAREL(bupivacaine) in your surgical incision to help control your pain after surgery.  EXPAREL is a local anesthetic that provides pain relief by numbing the tissue around the surgical site. EXPAREL is designed to release pain medication over time and can control pain for up to 72 hours. Depending on how you respond to EXPAREL, you may require less pain medication during your recovery.  Possible side effects: Temporary loss of sensation or ability to move in the area where bupivacaine was injected. Nausea, vomiting, constipation Rarely, numbness and tingling in your mouth or lips, lightheadedness, or anxiety may occur. Call your doctor right away if you think you may be experiencing any of these sensations, or if you have other questions regarding possible side effects.  Follow all other discharge instructions given to you by your surgeon or nurse. Eat a healthy diet and drink plenty of water or other fluids.  If you return to the hospital for any reason within 96 hours following the administration of EXPAREL, please inform your health care providers.

## 2021-10-20 NOTE — Transfer of Care (Signed)
Immediate Anesthesia Transfer of Care Note  Patient: [facility]  Procedure(s) Performed: Procedure(s) (LRB): MINI LAPAROTOMY FOR CYST DECOMPRESSION (N/A) XI ROBOTIC ASSISTED LEFT SALPINGO OOPHORECTOMY, ,FERTILITY STAGING (N/A)  Patient Location: PACU  Anesthesia Type: General  Level of Consciousness: awake, sedated, patient cooperative and responds to stimulation  Airway & Oxygen Therapy: Patient Spontanous Breathing and Patient connected to [place] 02 and soft FM   Post-op Assessment: Report given to PACU RN, Post -op Vital signs reviewed and stable and Patient moving all extremities  Post vital signs: Reviewed and stable  Complications: No apparent anesthesia complications

## 2021-10-20 NOTE — Anesthesia Procedure Notes (Signed)
Procedure Name: Intubation Date/Time: 10/20/2021 12:58 PM Performed by: Justice Rocher, CRNA Pre-anesthesia Checklist: Patient identified, Emergency Drugs available, Suction available, Patient being monitored and Timeout performed Patient Re-evaluated:Patient Re-evaluated prior to induction Oxygen Delivery Method: Circle system utilized Preoxygenation: Pre-oxygenation with 100% oxygen Induction Type: IV induction Ventilation: Mask ventilation without difficulty Laryngoscope Size: Mac and 3 Grade View: Grade II Tube type: Oral Tube size: 7.0 mm Number of attempts: 1 Airway Equipment and Method: Stylet and Oral airway Placement Confirmation: ETT inserted through vocal cords under direct vision, positive ETCO2, breath sounds checked- equal and bilateral and CO2 detector Secured at: 23 cm Tube secured with: Tape Dental Injury: Teeth and Oropharynx as per pre-operative assessment

## 2021-10-20 NOTE — Op Note (Signed)
OPERATIVE NOTE  Pre-operative Diagnosis: Large abdominopelvic cystic mass, normal tumor markers  Post-operative Diagnosis: same, benign ovarian cyst on frozen with evidence of hemorrhage  Operation: Mini-lap for cyst decompression with drainage of 7.5L of brown cystic fluid collected, robotic assisted left salpingo-oophorectomy   Surgeon: Jeral Pinch MD  Assistant Surgeon: Joylene John NP  Anesthesia: GET  Urine Output: 25cc  Operative Findings: On EUA, gigantic cystic mass filling the entire pelvis and abdomen. Small retroverted uterus. On intra-abdominal entry, no ascites. Brown fluid noted on controlled cyst decompression. Multiloculated cystic mass occupying the abdomen and pelvis. Cystic mass mostly white and smooth. On robotic survey, normal upper abdomen. Normal omentum, small and large bowel. 8cm normal appearing uterus. Normal right ovary. Elongated but otherwise normal appearing right tube. No intra-abdominal or pelvic evidence of disease.  Estimated Blood Loss:  less than 50 mL      Total IV Fluids: see I&O flowsheet         Specimens: pelvic washings, LSO         Complications:  None apparent; patient tolerated the procedure well.         Disposition: PACU - hemodynamically stable.  Procedure Details  The patient was seen in the Holding Room. The risks, benefits, complications, treatment options, and expected outcomes were discussed with the patient.  The patient concurred with the proposed plan, giving informed consent.  The site of surgery properly noted/marked. The patient was identified as [facility] and the procedure verified as a mini-lap for cyst decompression, USO, possible fertility sparing staging.  After induction of anesthesia, the patient was draped and prepped in the usual sterile manner. Patient was placed in supine position after anesthesia and draped and prepped in the usual sterile manner as follows: Her arms were tucked to her side with all  appropriate precautions.  The shoulders were stabilized with padded shoulder blocks applied to the acromium processes.  The patient was placed in the semi-lithotomy position in [place].  The perineum and vagina were prepped with CholoraPrep. The patient was draped after the CholoraPrep had been allowed to dry for 3 minutes.  A Time Out was held and the above information confirmed.  The urethra was prepped with Betadine. Foley catheter was placed.  OG tube placement was confirmed and to suction.   A 6 cm incision was made just lateral to the umbilicus with the scalpel. This was carried down to and through the fascia with monopolar electrocautery. The peritoneum was elevated and entered sharply. The incision was extended along its length. Pelvic washings were collected. A 2-0 stitch was used to make a pursestring on the mass and the gallbladder trocar was used to puncture and drain the more inferior cystic component. Once no further drainage noted, the same procedure was performed with the superior cystic component. Both sutures were tied. The Alexis retractor and laparoscopic cap system were then placed and a robot trocar was inserted through the cap system. The abdomen was then insufflated to a pressure of 12 mm Hg.   Next, a 10 mm skin incision was made 1 cm below the subcostal margin in the midclavicular line.  A 10-12 port was placed under direct visualization. Next, an 8 mm skin incision were made in the right and left abdomen and ports were placed about 8 cm lateral midline mini-lap incision.  All ports were placed under direct visualization.  The patient was placed in steep Trendelenburg.  Bowel was folded away into the upper abdomen.  The  robot was docked in the normal manner.  The left peritoneum was opened parallel to the IP ligament to open the retroperitoneal space. The round ligaments was preserved. The ureter was noted to be on the medial leaf of the broad ligament.  The peritoneum above  the ureter was incised and stretched and the infundibulopelvic ligament was skeletonized, cauterized and cut.  The utero-ovarian ligament and fallopian tube were isolated, cauterized and transected, freeing the left adnexa. Pedicles were inspected and excellent hemostasis was achieved.  Given bleeding along peritoneum adjacent to the sigmoid colon, a figure of eight stitch using 2-0 Vicryl was used to achieve hemostasis. Irrigation was used and excellent hemostasis was achieved. Arista was placed along surgical bed to help decrease risk of adhesion formation.  The robot was then undocked after robotic instruments were removed under direct visualization. The laparoscopic cap was removed and the gas turned off. The left adnexa was delivered through the incision with need to drain (in a contained manner) another cystic component. Once delivered, the left adnexa was handed off the field to be sent to pathology. Once pathology revealed benign mass, the procedure was completed.  The fascia of the mini-lap incision was closed with two running #1 PDS sutures tied in the midline. The incision was irrigated and Exparel was injected for local anesthesia. 2-0 Vicryl was used in running fashion to bury the PDS knots.   The fascia at the 10-12 mm port was closed with 0 Vicryl on a UR-5 needle.  The subcuticular tissue of all incisions was closed with 4-0 Vicryl and the skin was closed with 4-0 Monocryl in a subcuticular manner.  Dermabond was applied.    The vagina was swabbed with  minimal bleeding noted.  Foley catheter was removed. All sponge, lap and needle counts were correct x  3.   The patient was transferred to the recovery room in stable condition.  Jeral Pinch, MD

## 2021-10-20 NOTE — Progress Notes (Signed)
Tried to call patient's sister, Wyman Songster, multiple times (rang once and then busy signal). Went out to waiting area and she was not there.  Jeral Pinch MD Gynecologic Oncology

## 2021-10-20 NOTE — Anesthesia Preprocedure Evaluation (Addendum)
Anesthesia Evaluation  Patient identified by MRN, date of birth, ID band Patient awake    Reviewed: Allergy & Precautions, NPO status , Patient's Chart, lab work & pertinent test results  Airway Mallampati: II  TM Distance: >3 FB Neck ROM: Full    Dental no notable dental hx.    Pulmonary neg pulmonary ROS,    Pulmonary exam normal breath sounds clear to auscultation       Cardiovascular Exercise Tolerance: Good negative cardio ROS Normal cardiovascular exam Rhythm:Regular Rate:Normal     Neuro/Psych negative neurological ROS  negative psych ROS   GI/Hepatic negative GI ROS, Neg liver ROS,   Endo/Other  negative endocrine ROS  Renal/GU negative Renal ROS  negative genitourinary   Musculoskeletal negative musculoskeletal ROS (+)   Abdominal   Peds negative pediatric ROS (+)  Hematology negative hematology ROS (+)   Anesthesia Other Findings   Reproductive/Obstetrics Pelvic mass                             Anesthesia Physical Anesthesia Plan  ASA: 2  Anesthesia Plan: General   Post-op Pain Management:    Induction: Intravenous  PONV Risk Score and Plan: 3 and Scopolamine patch - Pre-op, Treatment may vary due to age or medical condition, Midazolam, Ondansetron and Dexamethasone  Airway Management Planned: Oral ETT  Additional Equipment: None  Intra-op Plan:   Post-operative Plan: Extubation in OR  Informed Consent: I have reviewed the patients History and Physical, chart, labs and discussed the procedure including the risks, benefits and alternatives for the proposed anesthesia with the patient or authorized representative who has indicated his/her understanding and acceptance.     Dental advisory given  Plan Discussed with: CRNA and Anesthesiologist  Anesthesia Plan Comments:        Anesthesia Quick Evaluation

## 2021-10-20 NOTE — Interval H&P Note (Signed)
History and Physical Interval Note:  10/20/2021 12:05 PM  [name]  has presented today for surgery, with the diagnosis of PELVIC MASS.  The various methods of treatment have been discussed with the patient and family. After consideration of risks, benefits and other options for treatment, the patient has consented to  Procedure(s): MINI LAPAROTOMY FOR CYST DECOMPRESSION (N/A) XI ROBOTIC ASSISTED UNILATERAL SALPINGO OOPHORECTOMY, MASS EXCISION POSSIBLE FERTILITY STAGING (N/A) as a surgical intervention.  The patient's history has been reviewed, patient examined, no change in status, stable for surgery.  I have reviewed the patient's chart and labs.  Questions were answered to the patient's satisfaction.     Sherry Ryan

## 2021-10-20 NOTE — Anesthesia Postprocedure Evaluation (Signed)
Anesthesia Post Note  Patient: [name]  Procedure(s) Performed: MINI LAPAROTOMY FOR CYST DECOMPRESSION (Abdomen) XI ROBOTIC ASSISTED LEFT SALPINGO OOPHORECTOMY, ,FERTILITY STAGING (Abdomen)     Patient location during evaluation: PACU Anesthesia Type: General Level of consciousness: awake Pain management: pain level controlled Vital Signs Assessment: post-procedure vital signs reviewed and stable Respiratory status: spontaneous breathing and respiratory function stable Cardiovascular status: stable Postop Assessment: no apparent nausea or vomiting Anesthetic complications: no   No notable events documented.  Last Vitals:  Vitals:   10/20/21 1152  BP: (!) 138/98  Pulse: 98  Resp: 16  Temp: 37.3 C  SpO2: 99%    Last Pain:  Vitals:   10/20/21 1152  TempSrc: Oral  PainSc: 0-No pain                 Merlinda Frederick

## 2021-10-21 ENCOUNTER — Telehealth: Payer: Self-pay

## 2021-10-21 ENCOUNTER — Encounter (HOSPITAL_BASED_OUTPATIENT_CLINIC_OR_DEPARTMENT_OTHER): Payer: Self-pay | Admitting: Gynecologic Oncology

## 2021-10-21 NOTE — Telephone Encounter (Signed)
Spoke with Ms. [place].She states she is eating, drinking and urinating well. She has not had a BM yet but is passing gas. She is taking senokot as prescribed and encouraged her to drink plenty of water. If no BM by tomorrow mid day, she is to increase the senokot-s to 2 tabs bid. She denies fever or chills. Incisions are dry and intact. The honeycomb dressing can be removed 5 days after surgery which will be 10-25-21.  She does not need to apply another dressing. Leave it open to the air. Her pain is controlled with ibuprofen alt. with tylenol. Occasional Oxycodone 5 mg.  Instructed to call office with any fever, chills, purulent drainage, uncontrolled pain or any other questions or concerns. Patient verbalizes understanding.  Pt aware of post op appointments as well as the office number [phone] and after hours number [phone] to call if she has any questions or concerns

## 2021-10-25 ENCOUNTER — Telehealth: Payer: Self-pay | Admitting: Gynecologic Oncology

## 2021-10-25 LAB — CYTOLOGY - NON PAP

## 2021-10-25 LAB — SURGICAL PATHOLOGY

## 2021-10-25 NOTE — Telephone Encounter (Signed)
Called patient - reviewed final pathology. She is very happy with this news. Doinv well from a post-op standpoint, no issues.  Jeral Pinch MD Gynecologic Oncology

## 2021-10-26 ENCOUNTER — Telehealth: Payer: Medicaid Other | Admitting: Gynecologic Oncology

## 2021-11-08 ENCOUNTER — Encounter: Payer: Self-pay | Admitting: Gynecologic Oncology

## 2021-11-09 ENCOUNTER — Other Ambulatory Visit: Payer: Self-pay

## 2021-11-09 ENCOUNTER — Inpatient Hospital Stay: Payer: Medicaid Other | Attending: Gynecologic Oncology | Admitting: Gynecologic Oncology

## 2021-11-09 ENCOUNTER — Encounter: Payer: Self-pay | Admitting: Gynecologic Oncology

## 2021-11-09 VITALS — BP 125/75 | HR 95 | Temp 98.1°F | Resp 16 | Ht 73.0 in | Wt 193.0 lb

## 2021-11-09 DIAGNOSIS — D271 Benign neoplasm of left ovary: Secondary | ICD-10-CM

## 2021-11-09 DIAGNOSIS — R1900 Intra-abdominal and pelvic swelling, mass and lump, unspecified site: Secondary | ICD-10-CM

## 2021-11-09 DIAGNOSIS — Z90721 Acquired absence of ovaries, unilateral: Secondary | ICD-10-CM

## 2021-11-09 NOTE — Progress Notes (Signed)
Gynecologic Oncology Return Clinic Visit  11/09/2021  Reason for Visit: Follow-up after surgery   Treatment History: 10/20/2021: Mini lap for cyst decompression, robotic assisted left salpingo-oophorectomy.  Interval History: Patient reports overall doing very well.  Denies any significant abdominal or pelvic pain.  Has gone back to work.  Reports good appetite without nausea or emesis.  Denies any issues related to bowel or bladder function.  Past Medical/Surgical History: Past Medical History:  Diagnosis Date   Medical history non-contributory    Pelvic mass 10/07/2021    Past Surgical History:  Procedure Laterality Date   LAPAROTOMY N/A 10/20/2021   Procedure: MINI LAPAROTOMY FOR CYST DECOMPRESSION;  Surgeon: Lafonda Mosses, MD;  Location: [facility];  Service: Gynecology;  Laterality: N/A;   NO PAST SURGERIES     ROBOTIC ASSISTED BILATERAL SALPINGO OOPHERECTOMY N/A 10/20/2021   Procedure: XI ROBOTIC ASSISTED LEFT SALPINGO OOPHORECTOMY, ,FERTILITY STAGING;  Surgeon: Lafonda Mosses, MD;  Location: [facility];  Service: Gynecology;  Laterality: N/A;    Family History  Problem Relation Age of Onset   Healthy Mother    Healthy Father    Breast cancer Maternal Grandmother    Colon cancer Neg Hx    Ovarian cancer Neg Hx    Endometrial cancer Neg Hx    Pancreatic cancer Neg Hx    Prostate cancer Neg Hx     Social History   Socioeconomic History   Marital status: Single    Spouse name: Not on file   Number of children: Not on file   Years of education: Not on file   Highest education level: Not on file  Occupational History   Not on file  Tobacco Use   Smoking status: Never   Smokeless tobacco: Never  Vaping Use   Vaping Use: Former  Substance and Sexual Activity   Alcohol use: Yes   Drug use: Never   Sexual activity: Not Currently    Birth control/protection: None  Other Topics Concern   Not on file  Social History  Narrative   Not on file   Social Determinants of Health   Financial Resource Strain: Not on file  Food Insecurity: No Food Insecurity   Worried About Running Out of Food in the Last Year: Never true   Ran Out of Food in the Last Year: Never true  Transportation Needs: No Transportation Needs   Lack of Transportation (Medical): No   Lack of Transportation (Non-Medical): No  Physical Activity: Not on file  Stress: Not on file  Social Connections: Not on file    Current Medications:  Current Outpatient Medications:    ibuprofen (ADVIL) 800 MG tablet, Take 1 tablet (800 mg total) by mouth every 8 (eight) hours as needed for moderate pain. For AFTER surgery only (Patient not taking: Reported on 11/08/2021), Disp: 30 tablet, Rfl: 0   oxyCODONE (OXY IR/ROXICODONE) 5 MG immediate release tablet, Take 1 tablet (5 mg total) by mouth every 4 (four) hours as needed for severe pain. For AFTER surgery only, do not take and drive (Patient not taking: Reported on 11/08/2021), Disp: 15 tablet, Rfl: 0   senna-docusate (SENOKOT-S) 8.6-50 MG tablet, Take 2 tablets by mouth at bedtime. For AFTER surgery, do not take if having diarrhea (Patient not taking: Reported on 11/08/2021), Disp: 30 tablet, Rfl: 0  Review of Systems: Denies appetite changes, fevers, chills, fatigue, unexplained weight changes. Denies hearing loss, neck lumps or masses, mouth sores, ringing in ears or voice  changes. Denies cough or wheezing.  Denies shortness of breath. Denies chest pain or palpitations. Denies leg swelling. Denies abdominal distention, pain, blood in stools, constipation, diarrhea, nausea, vomiting, or early satiety. Denies pain with intercourse, dysuria, frequency, hematuria or incontinence. Denies hot flashes, pelvic pain, vaginal bleeding or vaginal discharge.   Denies joint pain, back pain or muscle pain/cramps. Denies itching, rash, or wounds. Denies dizziness, headaches, numbness or seizures. Denies swollen  lymph nodes or glands, denies easy bruising or bleeding. Denies anxiety, depression, confusion, or decreased concentration.  Physical Exam: BP 125/75 (BP Location: Right Arm, Patient Position: Sitting)   Pulse 95   Temp 98.1 F (36.7 C) (Tympanic)   Resp 16   Ht 6\' 1"  (1.854 m)   Wt 193 lb (87.5 kg)   SpO2 100%   BMI 25.46 kg/m  General: Alert, oriented, no acute distress. HEENT: Normocephalic, atraumatic, sclera anicteric. Chest: Unlabored breathing on room air. Abdomen: soft, nontender.  Normoactive bowel sounds.  No masses or hepatosplenomegaly appreciated.  Well-healed incisions.  Remaining Dermabond removed. Extremities: Grossly normal range of motion.  Warm, well perfused.  No edema bilaterally.  Laboratory & Radiologic Studies: A. 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 woman status post robotic USO for large benign mass presenting for follow-up.  Patient is overall doing very well from a postoperative standpoint.  We discussed continued postoperative expectations as well as activity limitations.  Patient was given a copy of her final pathology report.  We reviewed what was done during surgery and benign diagnosis.  Patient voices understanding that she has 1 ovary and tube remaining.  I am releasing her back to care with her primary care provider and gynecologist but she is aware that she can call me with any future needs.  22 minutes of total time was spent for this patient encounter, including preparation, face-to-face counseling with the patient and coordination of care, and documentation of the encounter.  Jeral Pinch, MD  Division of Gynecologic Oncology  Department of Obstetrics and Gynecology  [facility]

## 2021-11-09 NOTE — Patient Instructions (Signed)
It was good to see you today.  You are healing great from surgery!  Remember, your lifting restrictions are in place until 6 weeks after surgery.  I am releasing you back to care with your gynecologist.  Please do not hesitate to contact me if you need anything in the future.

## 2021-12-28 ENCOUNTER — Other Ambulatory Visit: Payer: Self-pay | Admitting: Family Medicine

## 2021-12-28 DIAGNOSIS — R7401 Elevation of levels of liver transaminase levels: Secondary | ICD-10-CM

## 2022-01-11 ENCOUNTER — Ambulatory Visit
Admission: RE | Admit: 2022-01-11 | Discharge: 2022-01-11 | Disposition: A | Discharge status: Home / Self Care | Payer: Medicaid Other | Source: Ambulatory Visit | Attending: Family Medicine | Admitting: Family Medicine

## 2022-01-11 DIAGNOSIS — R7401 Elevation of levels of liver transaminase levels: Secondary | ICD-10-CM

## 2022-04-17 ENCOUNTER — Telehealth: Payer: Self-pay | Admitting: Hematology and Oncology

## 2022-04-17 NOTE — Telephone Encounter (Signed)
Scheduled appt per 5/1 referral. Pt is aware of appt date and time. Pt is aware to arrive 15 mins prior to appt time and to bring and updated insurance card. Pt is aware of appt location.   ?

## 2022-05-01 ENCOUNTER — Inpatient Hospital Stay: Payer: Medicaid Other

## 2022-05-01 ENCOUNTER — Inpatient Hospital Stay: Payer: Medicaid Other | Admitting: Hematology and Oncology

## 2022-05-01 ENCOUNTER — Telehealth: Payer: Self-pay | Admitting: Hematology and Oncology

## 2022-05-01 NOTE — Telephone Encounter (Signed)
R/s pt's new hem appt per provider being out of office. Spoke to pt who is aware of new appt date and time.  ?

## 2022-05-10 ENCOUNTER — Telehealth: Payer: Self-pay | Admitting: Hematology and Oncology

## 2022-05-10 NOTE — Telephone Encounter (Signed)
Pt called in to r/s her appt. She is aware of new appt date and time.

## 2022-05-11 ENCOUNTER — Inpatient Hospital Stay: Payer: Medicaid Other | Admitting: Hematology and Oncology

## 2022-05-11 ENCOUNTER — Inpatient Hospital Stay: Payer: Medicaid Other

## 2022-05-25 ENCOUNTER — Inpatient Hospital Stay: Payer: Medicaid Other | Attending: Hematology and Oncology | Admitting: Hematology and Oncology

## 2022-05-25 ENCOUNTER — Inpatient Hospital Stay: Payer: Medicaid Other

## 2022-06-07 IMAGING — US US PELVIS COMPLETE WITH TRANSVAGINAL
3 series · 13 of 25 positions shown · non-contrast
Comparison: None

CLINICAL DATA: ABDOMINAL AND PELVIC SWELLING

EXAM:
TRANSABDOMINAL AND TRANSVAGINAL ULTRASOUND OF PELVIS
ULTRASOUND ABDOMEN LIMITED
TECHNIQUE: Both transabdominal and transvaginal ultrasound examinations of the
pelvis were performed. Transabdominal technique was performed for
global imaging of the pelvis including uterus, ovaries, adnexal
regions, and pelvic cul-de-sac. It was necessary to proceed with
endovaginal exam following the transabdominal exam to visualize the
ovaries.

[Series 1: us pelvis complete with transvaginal · 0.30mm/px · 24 acquisitions, 6 frames shown (1 of 3)]
[im 1/24]
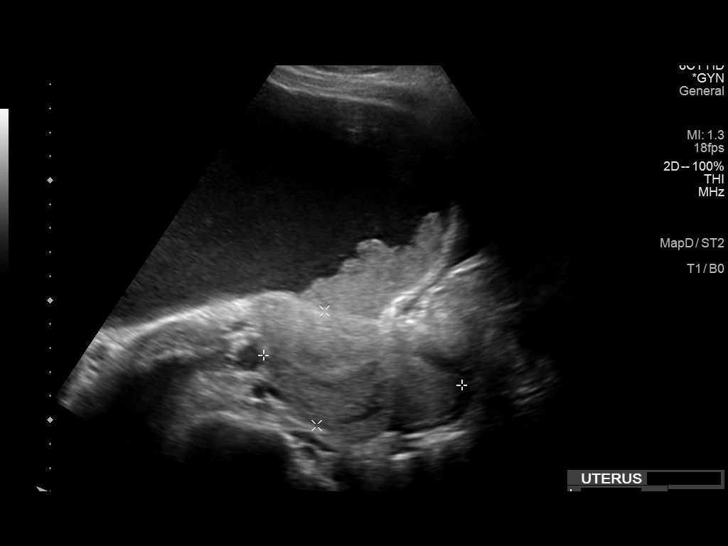
[im 5/24]
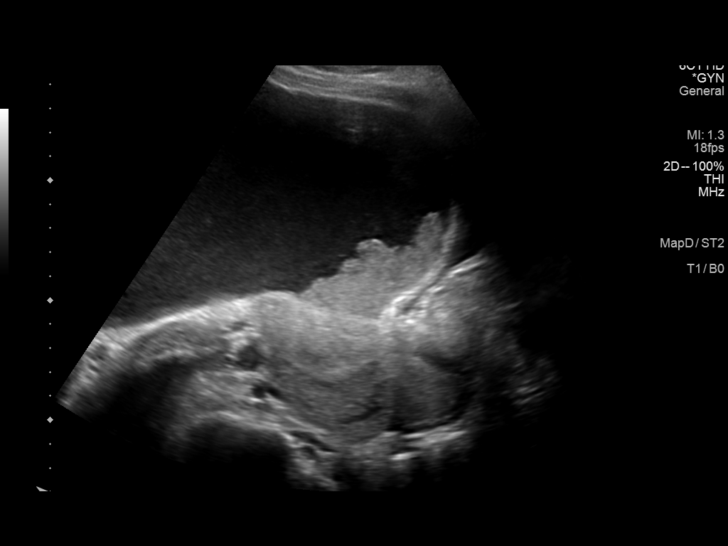
[im 10/24]
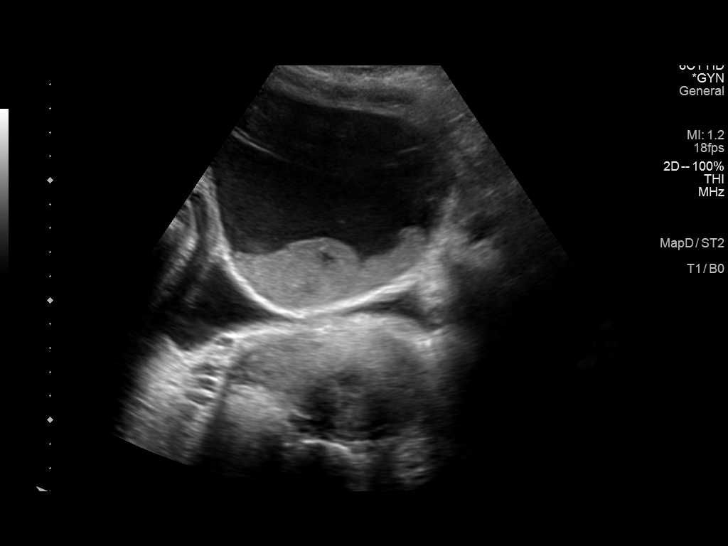
[im 14/24]
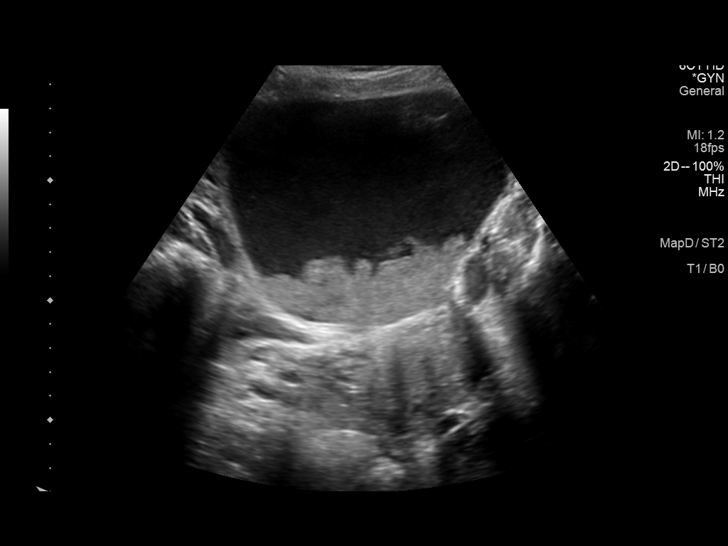
[im 19/24]
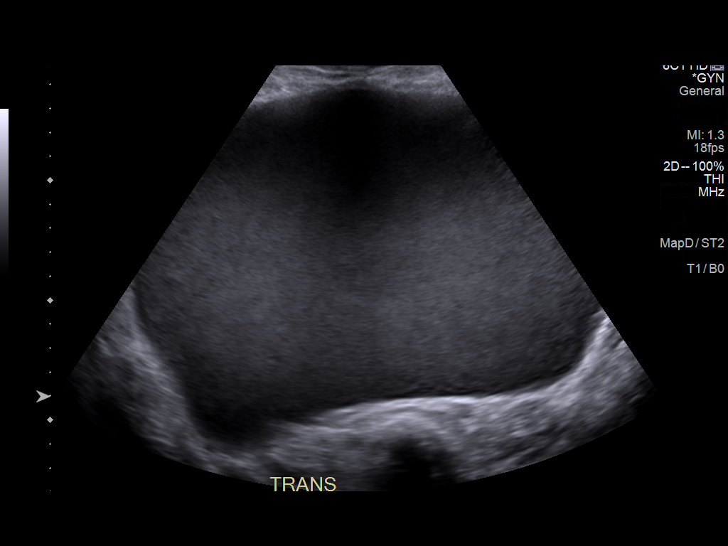
[im 24/24]
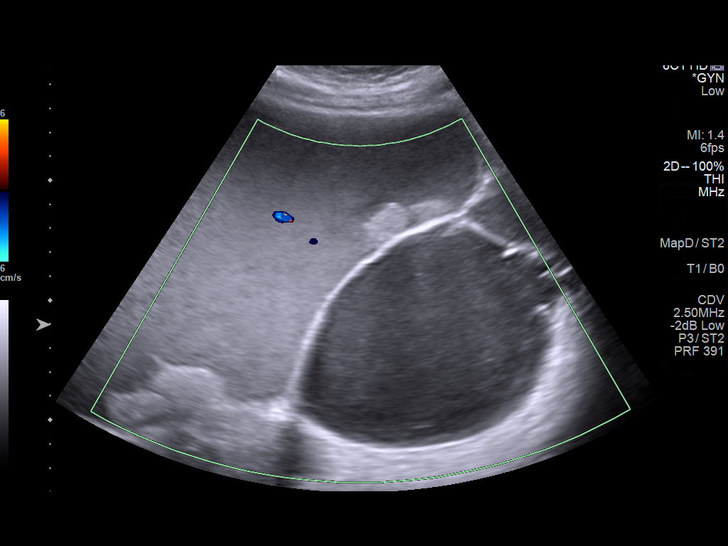

[Series 1001: us pelvis complete with transvaginal · 0.22mm/px · 6 of 28 slices shown (2 of 3)]
[im 3/28]
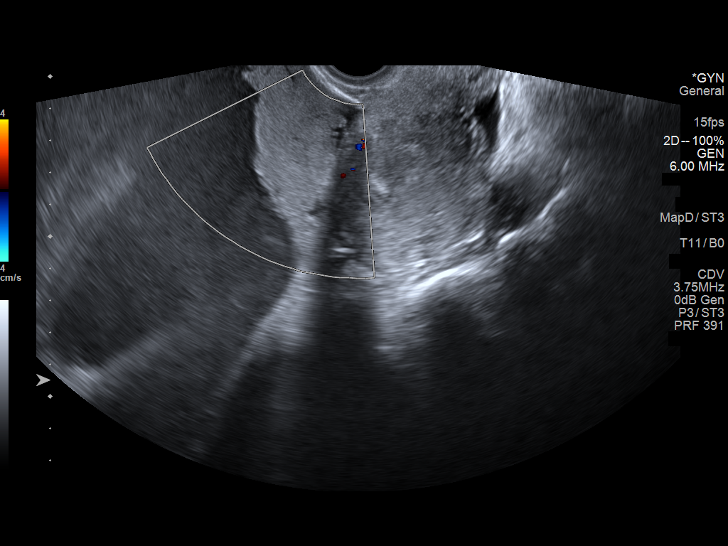
[im 8/28]
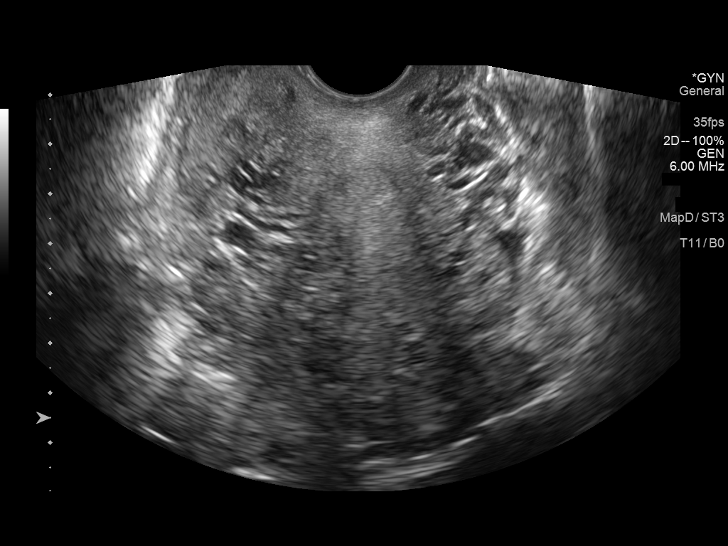
[im 13/28]
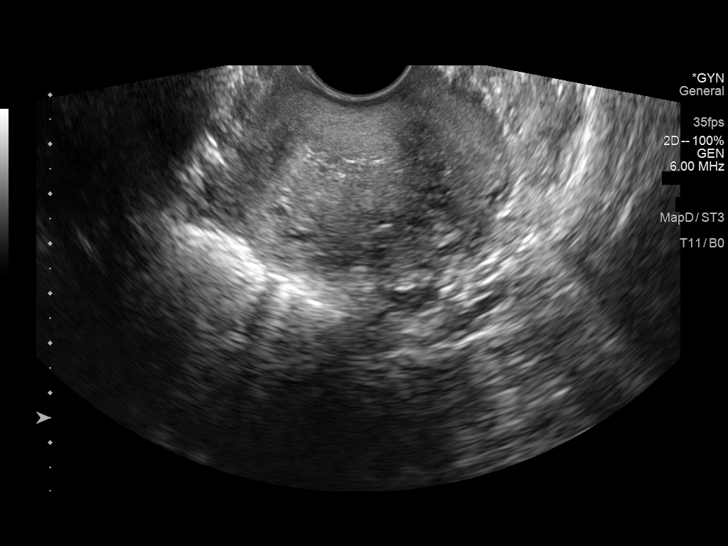
[im 18/28]
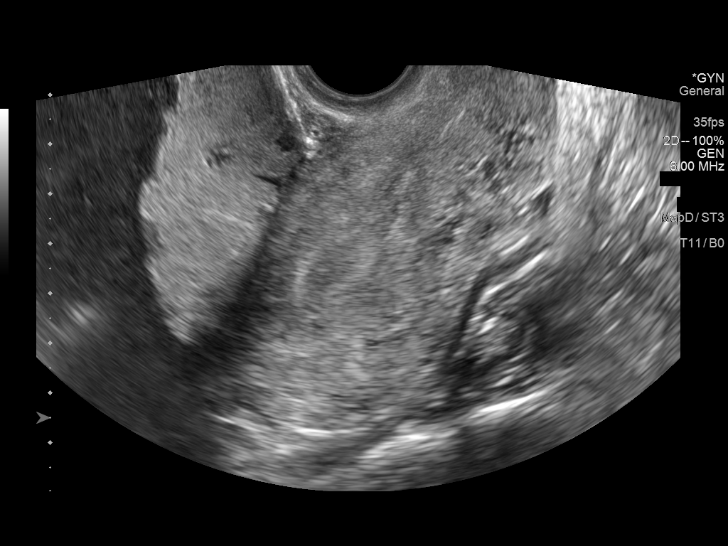
[im 23/28]
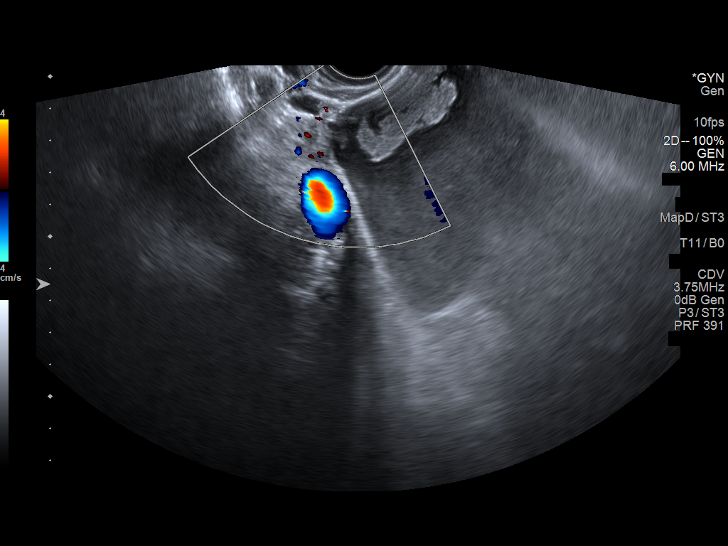
[im 28/28]
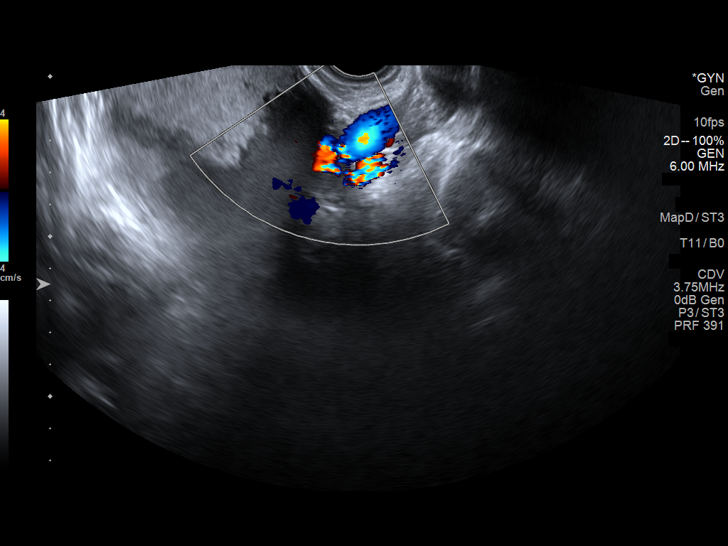

[Series 1002: us pelvis complete with transvaginal · 0.49mm/px · 1 of 4 slices shown (3 of 3)]
[im 4/4]
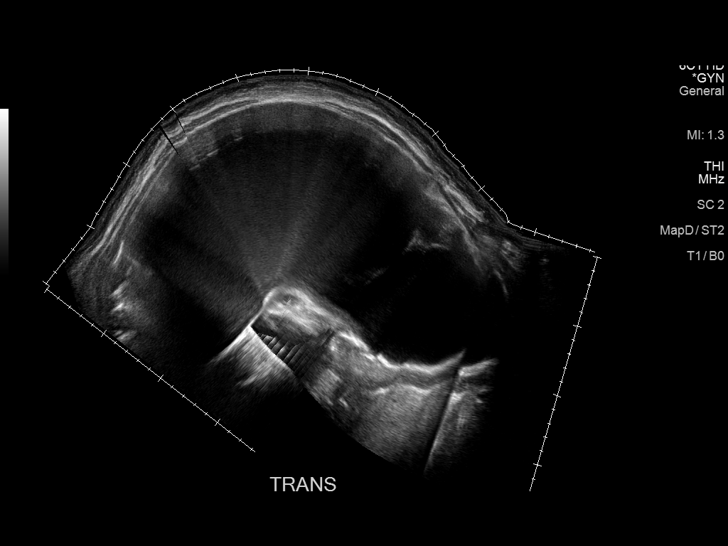

[13 of 25 positions shown; findings below may reference images not displayed]

FINDINGS: Uterus

Measurements: 8.4 x 4.8 x 6.4 cm = volume: 133.5 mL. No fibroids or
other mass visualized.

Endometrium

Thickness: 11.  No focal abnormality visualized.

Right ovary

Right ovary is difficult to measure and may be part of complex mass
or adjacent to complex cystic lesion.

Left ovary

Not visualized.

Other findings

Complex cystic lesion extending from the superior abdomen to the
lower pelvis with multiple septations and a solid component. It
measures approximately 36.8 x 11.9 x 23.5 cm. Limited color flow is
seen.
IMPRESSION: Large complex cystic lesion extending from the superior abdomen to
the lower pelvis with multiple septations and a solid component,
possibly arising from the right ovary. Finding is concerning for
neoplasm. Recommend contrast-enhanced CT of the abdomen and pelvis
for further evaluation.

## 2022-07-13 IMAGING — CT CT ABD-PELV W/ CM
2 of 4 series · 15 of 46 positions shown, 17 images · IV contrast (iopamidol)
Comparison: Ultrasound exam of the pelvis 08/18/2021.

CLINICAL DATA: Pelvic swelling.  Evaluate for mass lesion.

EXAM:
CT ABDOMEN AND PELVIS WITH CONTRAST
TECHNIQUE: Multidetector CT imaging of the abdomen and pelvis was performed
using the standard protocol following bolus administration of
intravenous contrast.
CONTRAST:  100mL [id number] IOPAMIDOL ([id number]) INJECTION 61%

[Series 2: abd pelvis 5.00 br40 s3 axial · axial · 0.80mm/px · z∈[+1269,+1734]mm · 12 of 103 slices shown, 14 images]
[im 5/103  soft-tissue]
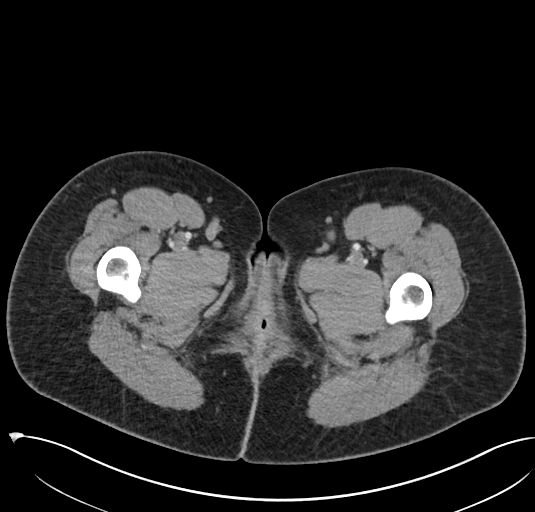
[im 5/103  bone]
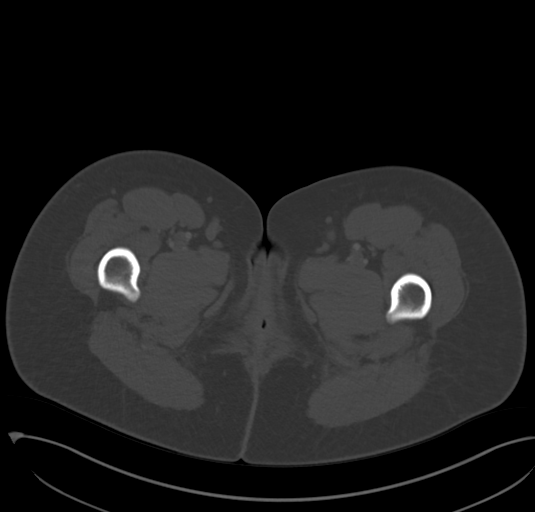
[im 14/103  soft-tissue]
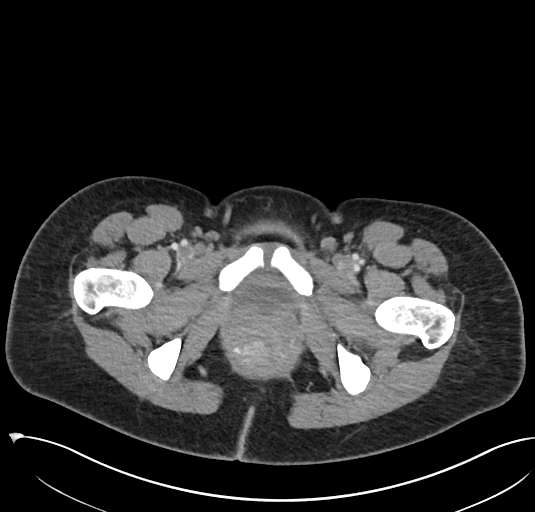
[im 23/103  soft-tissue]
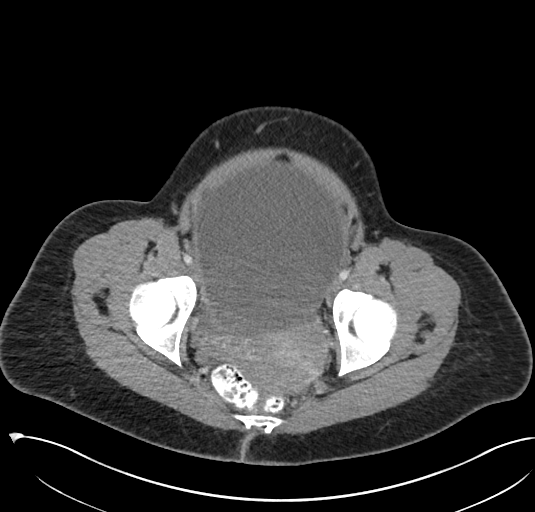
[im 32/103  soft-tissue]
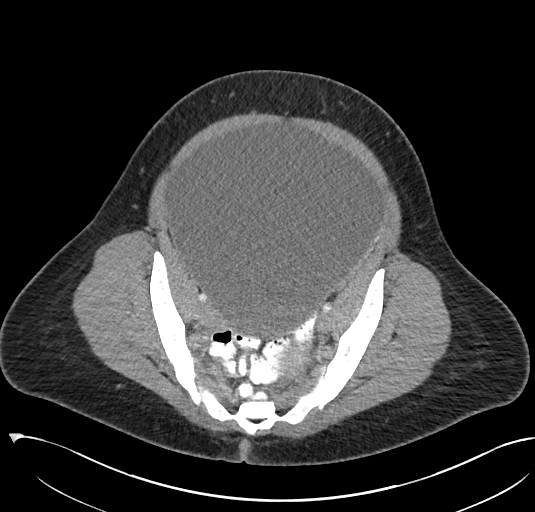
[im 40/103  soft-tissue]
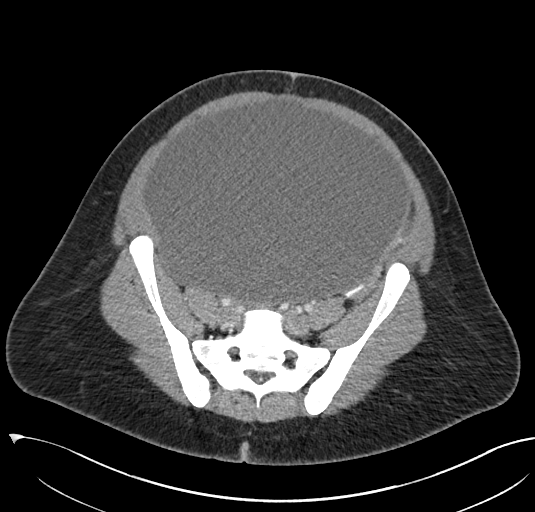
[im 49/103  soft-tissue]
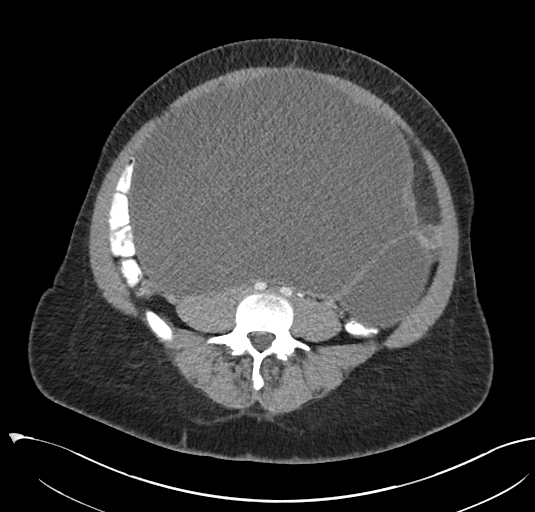
[im 54/103  soft-tissue]
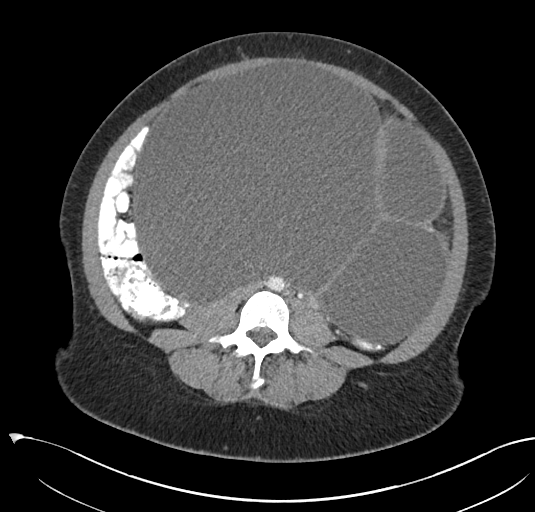
[im 63/103  soft-tissue]
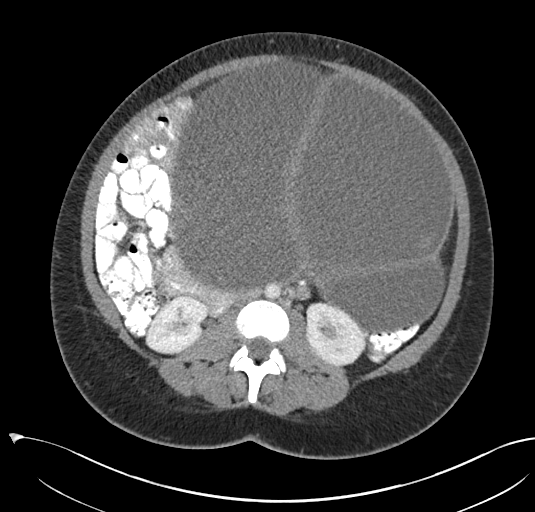
[im 71/103  soft-tissue]
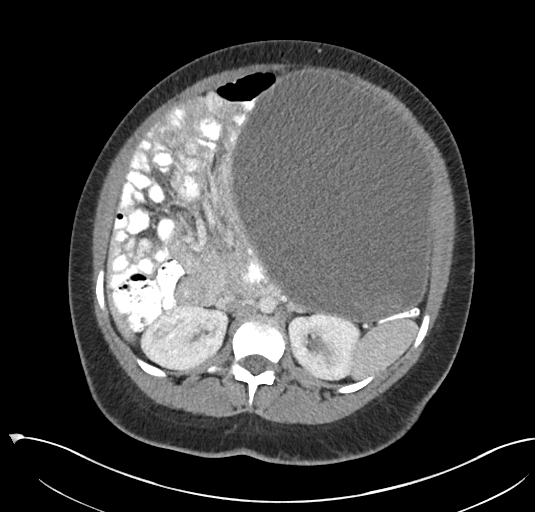
[im 71/103  bone]
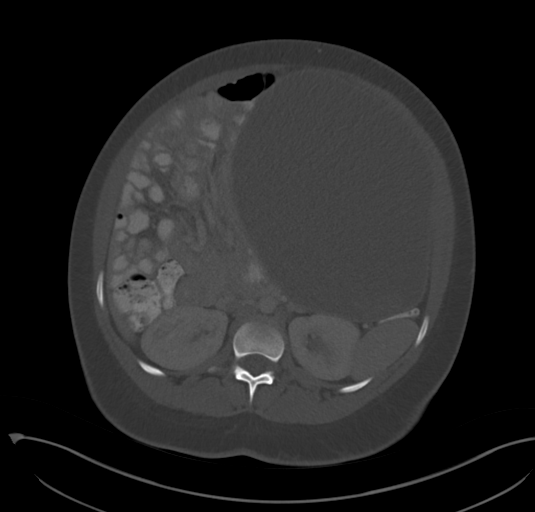
[im 80/103  soft-tissue]
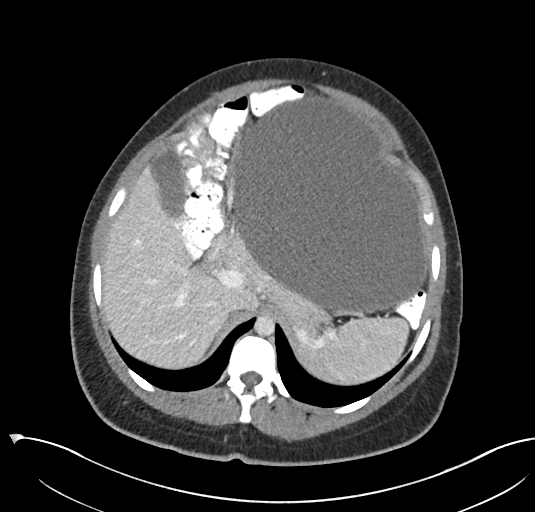
[im 89/103  soft-tissue]
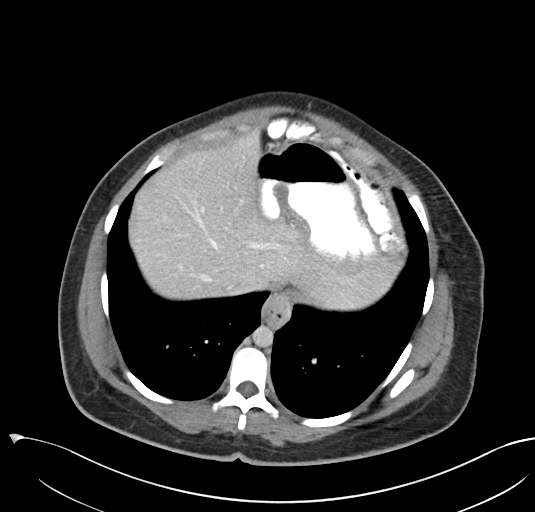
[im 98/103  soft-tissue]
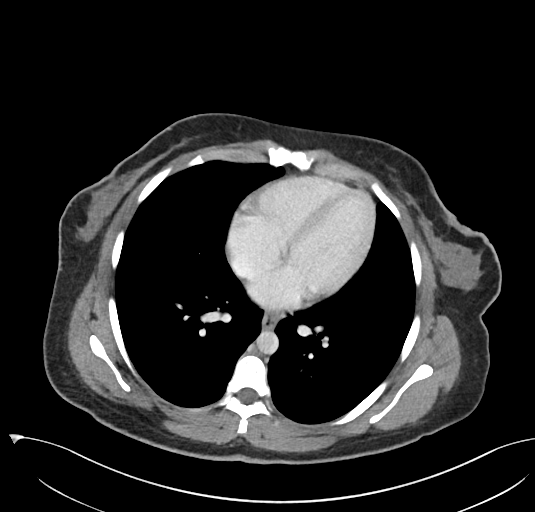

[Series 6: abd pelvis 2.00 br40 s3 cor · coronal · 0.84mm/px · 3 of 199 slices shown]
[im 67/199  soft-tissue]
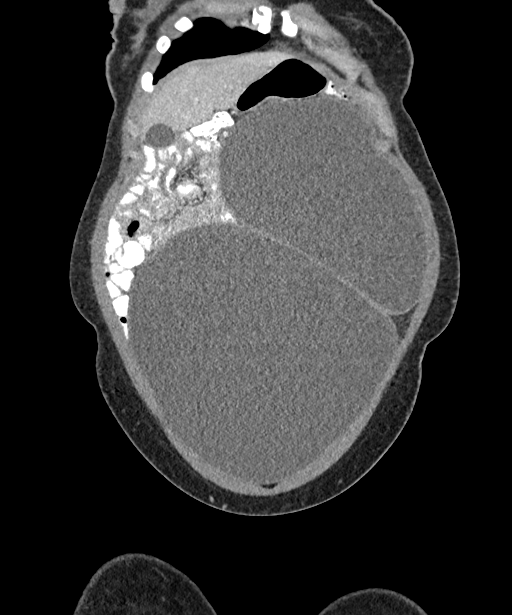
[im 89/199  soft-tissue]
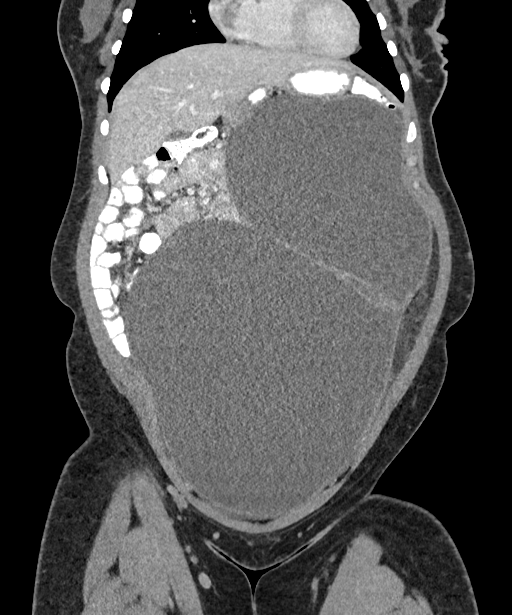
[im 111/199  soft-tissue]
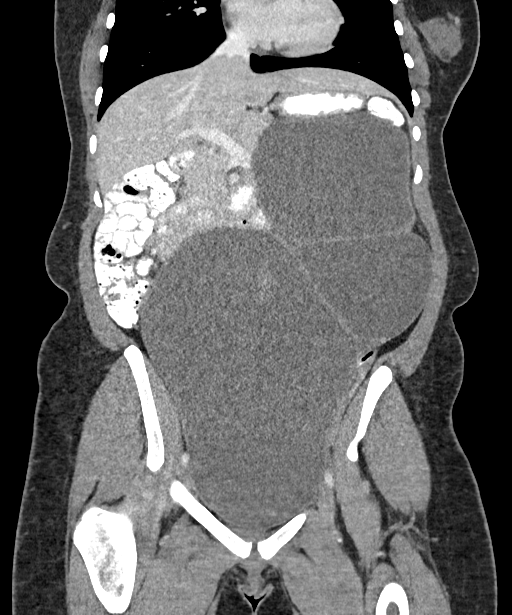

[15 of 46 positions shown; findings below may reference images not displayed]

FINDINGS: Lower chest: Unremarkable.

Hepatobiliary: No suspicious focal abnormality within the liver
parenchyma. There is no evidence for gallstones, gallbladder wall
thickening, or pericholecystic fluid. No intrahepatic or
extrahepatic biliary dilation.

Pancreas: No focal mass lesion. No dilatation of the main duct. No
intraparenchymal cyst. No peripancreatic edema.

Spleen: No splenomegaly. No focal mass lesion.

Adrenals/Urinary Tract: No adrenal nodule or mass. Kidneys
unremarkable. No evidence for hydroureter. Bladder decompressed.

Stomach/Bowel: Stomach is unremarkable. No gastric wall thickening.
No evidence of outlet obstruction. Duodenum not well seen secondary
to mass-effect from the large abdominopelvic cystic mass. Small
bowel is largely displaced into the right abdomen and pelvis. No
small bowel or colonic dilatation.

Vascular/Lymphatic: No abdominal aortic aneurysm. Portal vein and
superior mesenteric vein are patent.

Reproductive: Uterus unremarkable. Large multiloculated cystic mass
is identified in the abdomen and pelvis measuring approximately
x 23.1 x 16.7 cm. Lesion generates substantial mass-effect on bowel
loops and retroperitoneal anatomy of the abdomen and pelvis. Subtle
apparent soft tissue nodule measuring 4.4 cm nodule identified in
the posterior aspect of the most cranial loculation (see axial image
31 of series 2). There also appears to be a soft tissue nodule in
the cranial most component measuring 12 mm on axial 42/2 and visible
on sagittal 171/8. Septations measure approximately 5 mm in
thickness. The wall of the lesion appears relatively thin and smooth
throughout.

Other: No substantial ascites.

Musculoskeletal: No worrisome lytic or sclerotic osseous
abnormality.
IMPRESSION: 1. 35.7 x 23.1 x 16.7 cm multiloculated cystic mass in the abdomen
and pelvis generates substantial mass-effect on bowel loops and
retroperitoneal anatomy of the abdomen and pelvis. 2 soft tissue
nodules are identified in the cranial most component of the
multicystic mass. Imaging features concerning for neoplasm. Origin
of this lesion is not discernible by CT imaging although ovarian
etiology would be a distinct consideration. Mesenteric origin not
excluded.
2. No ascites or lymphadenopathy.

## 2022-10-31 IMAGING — US US ABDOMEN LIMITED
1 series · 14 of 25 positions shown · non-contrast
Comparison: None.

CLINICAL DATA: Elevated transaminase levels.

EXAM:
ULTRASOUND ABDOMEN LIMITED RIGHT UPPER QUADRANT

[Series 1: us abdomen limited · 0.23mm/px · 14 of 42 slices shown]
[im 1/42]
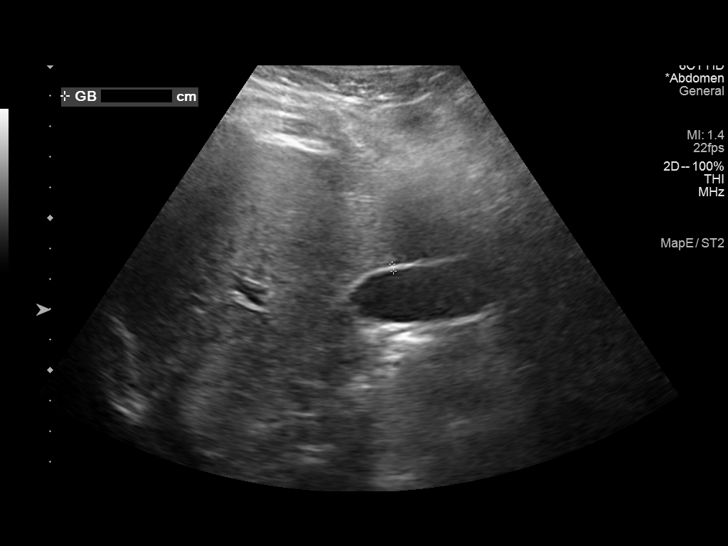
[im 4/42]
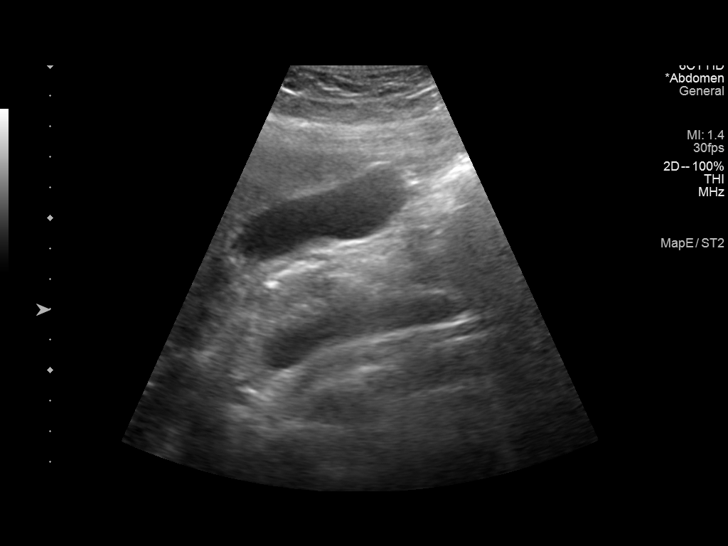
[im 7/42]
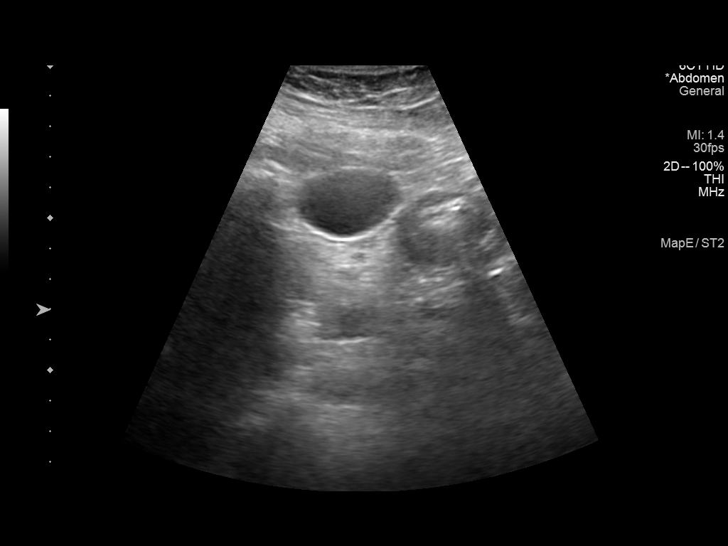
[im 11/42]
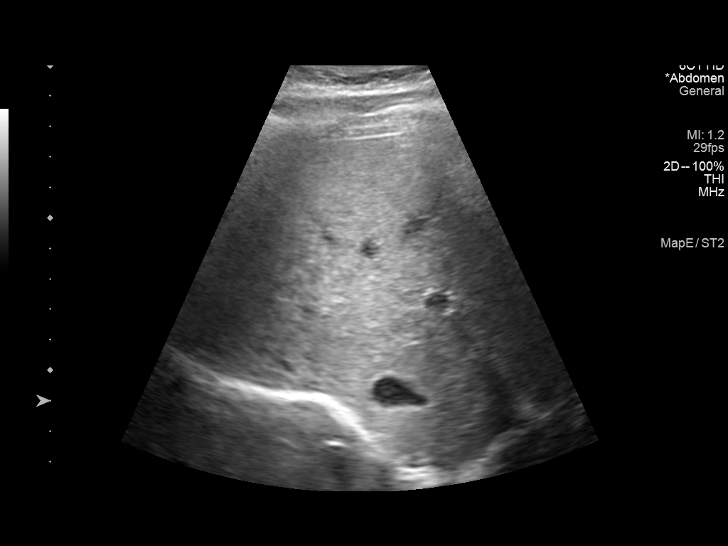
[im 14/42]
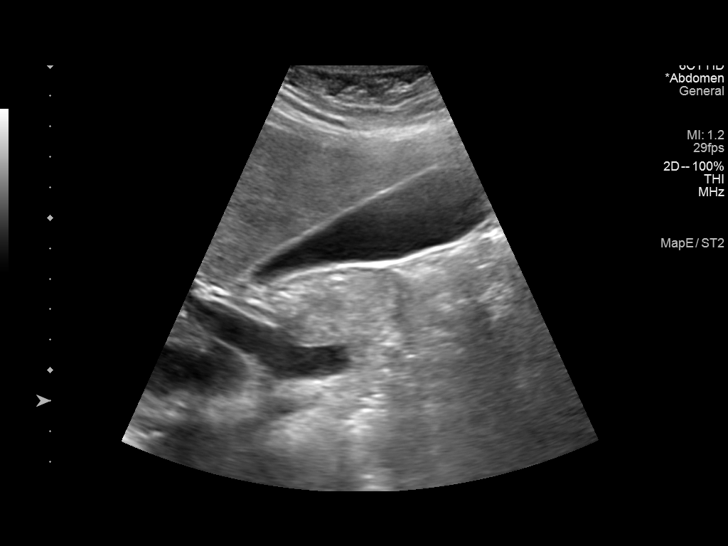
[im 16/42]
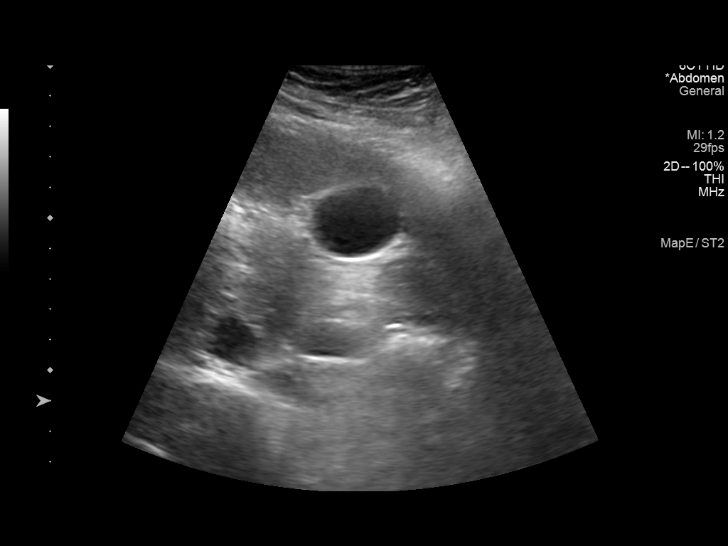
[im 19/42]
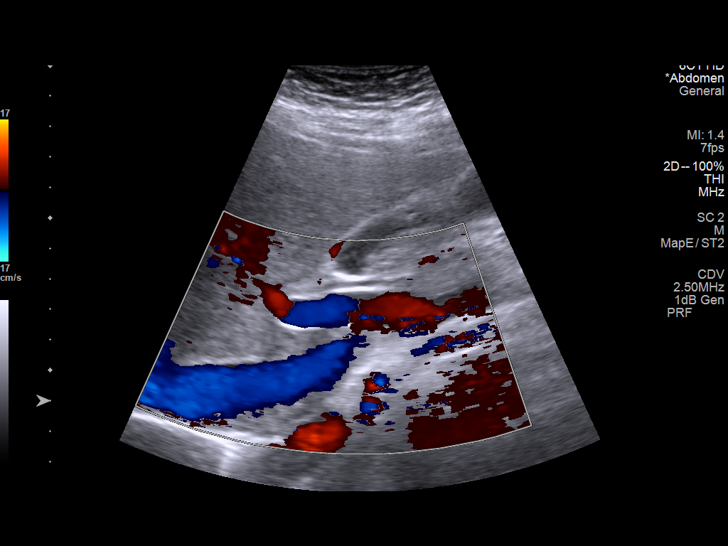
[im 23/42]
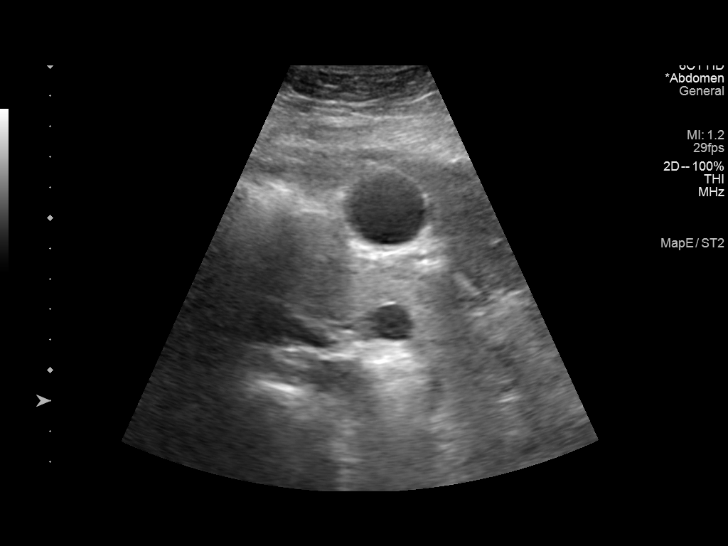
[im 26/42]
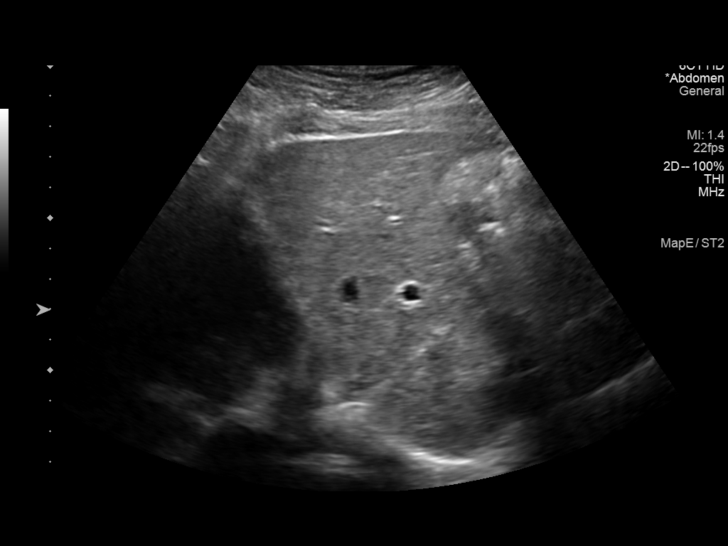
[im 28/42]
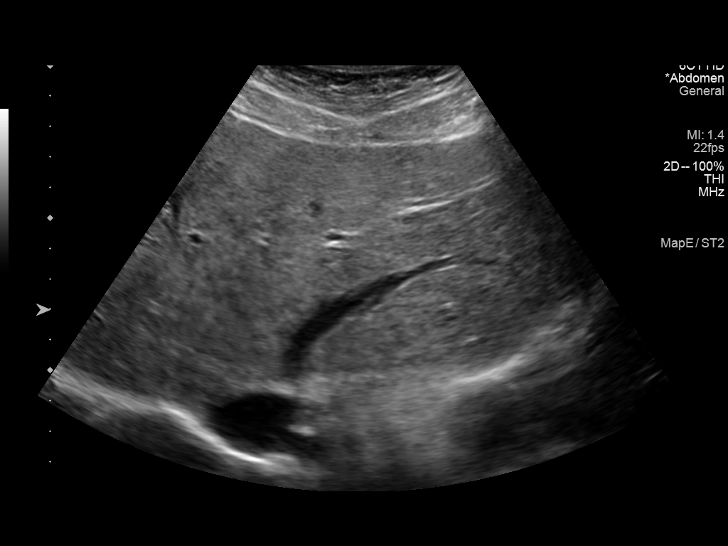
[im 31/42]
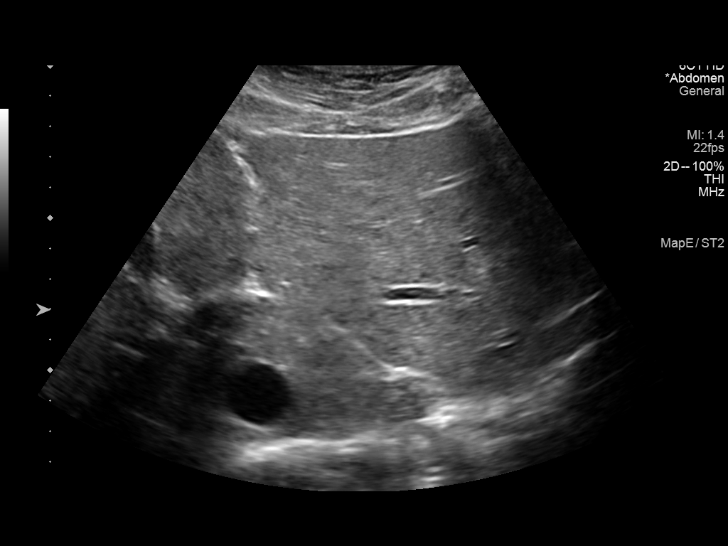
[im 35/42]
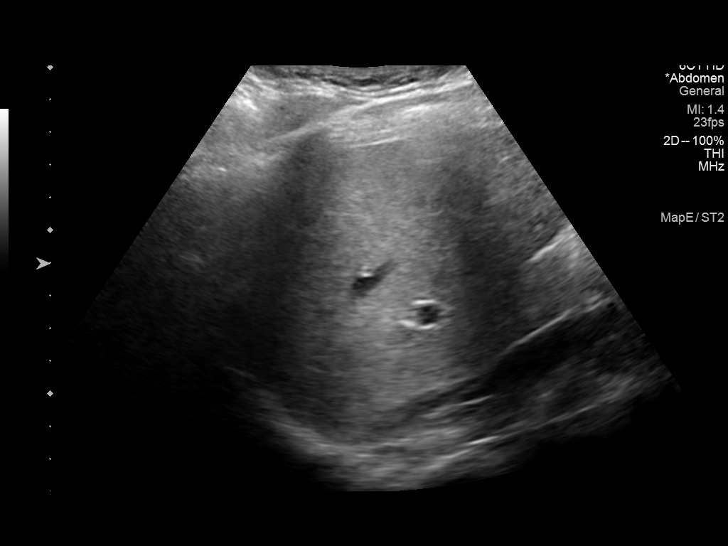
[im 38/42]
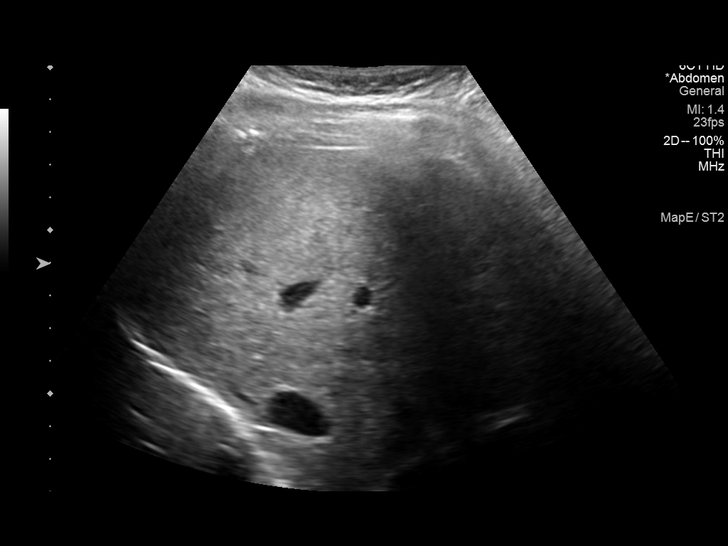
[im 42/42]
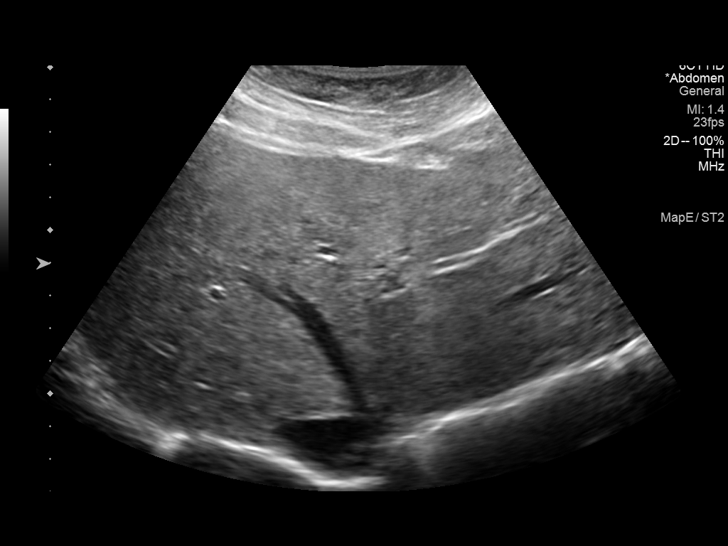

[14 of 25 positions shown; findings below may reference images not displayed]

FINDINGS: Gallbladder:

No gallstones or wall thickening visualized (2.4 mm). No sonographic
Murphy sign noted by sonographer.

Common bile duct:

Diameter: 1.6 mm

Liver:

No focal lesion identified. Diffusely increased echogenicity of the
liver parenchyma is noted. Portal vein is patent on color Doppler
imaging with normal direction of blood flow towards the liver.

Other: None.
IMPRESSION: Hepatic steatosis without focal liver lesions.

## 2024-08-15 ENCOUNTER — Encounter (HOSPITAL_BASED_OUTPATIENT_CLINIC_OR_DEPARTMENT_OTHER): Payer: Self-pay | Admitting: Internal Medicine

## 2024-08-15 DIAGNOSIS — R0683 Snoring: Secondary | ICD-10-CM

## 2024-08-15 DIAGNOSIS — R4689 Other symptoms and signs involving appearance and behavior: Secondary | ICD-10-CM
